# Patient Record
Sex: Male | Born: 1985 | Race: White | Hispanic: No | Marital: Married | Smoking: Former smoker
Health system: Southern US, Community
[De-identification: ages and names within clinical notes are randomized; demographics above are authoritative.]

## PROBLEM LIST (undated history)

## (undated) DIAGNOSIS — E669 Obesity, unspecified: Secondary | ICD-10-CM

## (undated) DIAGNOSIS — K219 Gastro-esophageal reflux disease without esophagitis: Secondary | ICD-10-CM

## (undated) DIAGNOSIS — R001 Bradycardia, unspecified: Secondary | ICD-10-CM

## (undated) DIAGNOSIS — T7840XA Allergy, unspecified, initial encounter: Secondary | ICD-10-CM

## (undated) HISTORY — DX: Obesity, unspecified: E66.9

## (undated) HISTORY — DX: Allergy, unspecified, initial encounter: T78.40XA

## (undated) HISTORY — DX: Bradycardia, unspecified: R00.1

---

## 2006-05-11 ENCOUNTER — Emergency Department (HOSPITAL_COMMUNITY): Admission: EM | Admit: 2006-05-11 | Discharge: 2006-05-11 | Payer: Self-pay | Admitting: Emergency Medicine

## 2007-11-08 ENCOUNTER — Emergency Department (HOSPITAL_COMMUNITY): Admission: EM | Admit: 2007-11-08 | Discharge: 2007-11-08 | Payer: Self-pay | Admitting: Emergency Medicine

## 2011-11-24 ENCOUNTER — Encounter: Payer: Self-pay | Admitting: Family

## 2011-12-08 ENCOUNTER — Encounter: Payer: Self-pay | Admitting: Family

## 2011-12-08 ENCOUNTER — Ambulatory Visit (INDEPENDENT_AMBULATORY_CARE_PROVIDER_SITE_OTHER): Payer: No Typology Code available for payment source | Admitting: Family

## 2011-12-08 VITALS — BP 116/80 | Ht 70.5 in | Wt 196.0 lb

## 2011-12-08 DIAGNOSIS — Z0000 Encounter for general adult medical examination without abnormal findings: Secondary | ICD-10-CM

## 2011-12-08 DIAGNOSIS — J309 Allergic rhinitis, unspecified: Secondary | ICD-10-CM

## 2011-12-08 DIAGNOSIS — Z23 Encounter for immunization: Secondary | ICD-10-CM

## 2011-12-08 LAB — CBC WITH DIFFERENTIAL/PLATELET
Basophils Absolute: 0 10*3/uL (ref 0.0–0.1)
Eosinophils Absolute: 0.2 10*3/uL (ref 0.0–0.7)
HCT: 45.9 % (ref 39.0–52.0)
Hemoglobin: 15.6 g/dL (ref 13.0–17.0)
Lymphs Abs: 1.9 10*3/uL (ref 0.7–4.0)
MCHC: 34 g/dL (ref 30.0–36.0)
Monocytes Relative: 6.9 % (ref 3.0–12.0)
Neutro Abs: 3.5 10*3/uL (ref 1.4–7.7)
RDW: 12.6 % (ref 11.5–14.6)

## 2011-12-08 LAB — BASIC METABOLIC PANEL
Calcium: 9.5 mg/dL (ref 8.4–10.5)
Creatinine, Ser: 0.8 mg/dL (ref 0.4–1.5)
GFR: 125.86 mL/min (ref >60.00)

## 2011-12-08 LAB — LIPID PANEL
Cholesterol: 162 mg/dL (ref 0–200)
Triglycerides: 37 mg/dL (ref 0.0–149.0)

## 2011-12-08 MED ORDER — FLUTICASONE PROPIONATE 50 MCG/ACT NA SUSP: 2.0000 | Freq: Every day | NASAL | Status: DC

## 2011-12-08 NOTE — Progress Notes (Signed)
  Subjective:    Patient ID: Timothy Coffey, male    DOB: [birth year], 26 y.o.   MRN: [id number]  HPI Patient presents for yearly preventative medicine examination. All immunizations and health maintenance protocols were reviewed with the patient and they are up to date with these protocols. Screening laboratory values were reviewed with the patient including screening of hyperlipidemia PSA renal function and hepatic function.There medications past medical history social history problem list and allergies were reviewed in detail. Goals were established with regard to weight loss exercise diet in compliance with medications   Review of Systems  Constitutional: Negative.   HENT: Positive for congestion, rhinorrhea, sneezing and postnasal drip.   Eyes: Negative.   Respiratory: Negative.   Cardiovascular: Negative.   Gastrointestinal: Negative.   Genitourinary: Negative.   Musculoskeletal: Negative.   Skin: Negative.   Neurological: Negative.   Hematological: Negative.   Psychiatric/Behavioral: Negative.    Past Medical History  Diagnosis Date  . Allergy     History   Social History  . Marital Status: Single    Spouse Name: N/A    Number of Children: N/A  . Years of Education: N/A   Occupational History  . Not on file.   Social History Main Topics  . Smoking status: Former Games developer  . Smokeless tobacco: Not on file  . Alcohol Use: Yes  . Drug Use: No  . Sexually Active:    Other Topics Concern  . Not on file   Social History Narrative  . No narrative on file    History reviewed. No pertinent past surgical history.  Family History  Problem Relation Age of Onset  . Arthritis Father     No Known Allergies  Current Outpatient Prescriptions on File Prior to Visit  Medication Sig Dispense Refill  . fluticasone (FLONASE) 50 MCG/ACT nasal spray Place 2 sprays into the nose daily.  16 g  6    BP 116/80  Ht 5' 10.5" (1.791 m)  Wt 196 lb (88.905 kg)  BMI 27.73  kg/m2chart    Objective:   Physical Exam  Constitutional: He is oriented to person, place, and time. He appears well-developed and well-nourished.  HENT:  Head: Normocephalic.  Right Ear: External ear normal.  Left Ear: External ear normal.  Nose: Nose normal.  Mouth/Throat: Oropharynx is clear and moist.  Eyes: Conjunctivae are normal. Pupils are equal, round, and reactive to light.  Neck: Normal range of motion. Neck supple.  Cardiovascular: Normal rate, regular rhythm and normal heart sounds.   Pulmonary/Chest: Effort normal and breath sounds normal.  Abdominal: Soft. Bowel sounds are normal.  Genitourinary: Penis normal.  Musculoskeletal: Normal range of motion.  Neurological: He is alert and oriented to person, place, and time.  Skin: Skin is warm and dry.  Psychiatric: He has a normal mood and affect.          Assessment & Plan:  Assessment: CPX, Allergic Rhinitis  Plan: Encouraged diet and exercise, testicular exams. Fluticasone 2 sprays in each nostril once a day and Zyrtec 10 mg once a day to help control his allergies. Labs sent to include BMP, CBC, lipids will notify patient pending results. Tdap administered. Call the office if his symptoms worsen or persist, recheck as scheduled and when necessary.

## 2011-12-08 NOTE — Patient Instructions (Addendum)
Allergic Rhinitis Allergic rhinitis is when the mucous membranes in the nose respond to allergens. Allergens are particles in the air that cause your body to have an allergic reaction. This causes you to release allergic antibodies. Through a chain of events, these eventually cause you to release histamine into the blood stream (hence the use of antihistamines). Although meant to be protective to the body, it is this release that causes your discomfort, such as frequent sneezing, congestion and an itchy runny nose.  CAUSES  The pollen allergens may come from grasses, trees, and weeds. This is seasonal allergic rhinitis, or "hay fever." Other allergens cause year-round allergic rhinitis (perennial allergic rhinitis) such as house dust mite allergen, pet dander and mold spores.  SYMPTOMS   Nasal stuffiness (congestion).   Runny, itchy nose with sneezing and tearing of the eyes.   There is often an itching of the mouth, eyes and ears.  It cannot be cured, but it can be controlled with medications. DIAGNOSIS  If you are unable to determine the offending allergen, skin or blood testing may find it. TREATMENT   Avoid the allergen.   Medications and allergy shots (immunotherapy) can help.   Hay fever may often be treated with antihistamines in pill or nasal spray forms. Antihistamines block the effects of histamine. There are over-the-counter medicines that may help with nasal congestion and swelling around the eyes. Check with your caregiver before taking or giving this medicine.  If the treatment above does not work, there are many new medications your caregiver can prescribe. Stronger medications may be used if initial measures are ineffective. Desensitizing injections can be used if medications and avoidance fails. Desensitization is when a patient is given ongoing shots until the body becomes less sensitive to the allergen. Make sure you follow up with your caregiver if problems continue. SEEK  MEDICAL CARE IF:   You develop fever (more than 100.5 F (38.1 C).   You develop a cough that does not stop easily (persistent).   You have shortness of breath.   You start wheezing.   Symptoms interfere with normal daily activities.  Document Released: 04/25/2001 Document Revised: 07/20/2011 Document Reviewed: 11/04/2008 [facility] Patient Information 2012 [place], [place].  Exercise to Stay Healthy Exercise helps you become and stay healthy. EXERCISE IDEAS AND TIPS Choose exercises that:  You enjoy.   Fit into your day.  You do not need to exercise really hard to be healthy. You can do exercises at a slow or medium level and stay healthy. You can:  Stretch before and after working out.   Try yoga, Pilates, or tai chi.   Lift weights.   Walk fast, swim, jog, run, climb stairs, bicycle, dance, or rollerskate.   Take aerobic classes.  Exercises that burn about 150 calories:  Running 1  miles in 15 minutes.   Playing volleyball for 45 to 60 minutes.   Washing and waxing a car for 45 to 60 minutes.   Playing touch football for 45 minutes.   Walking 1  miles in 35 minutes.   Pushing a stroller 1  miles in 30 minutes.   Playing basketball for 30 minutes.   Raking leaves for 30 minutes.   Bicycling 5 miles in 30 minutes.   Walking 2 miles in 30 minutes.   Dancing for 30 minutes.   Shoveling snow for 15 minutes.   Swimming laps for 20 minutes.   Walking up stairs for 15 minutes.   Bicycling 4 miles in 15  minutes.   Gardening for 30 to 45 minutes.   Jumping rope for 15 minutes.   Washing windows or floors for 45 to 60 minutes.  Document Released: 09/02/2010 Document Revised: 07/20/2011 Document Reviewed: 09/02/2010 [facility] Patient Information 2012 [place], [place].

## 2012-07-17 ENCOUNTER — Other Ambulatory Visit: Payer: Self-pay | Admitting: Family

## 2015-10-29 ENCOUNTER — Ambulatory Visit (INDEPENDENT_AMBULATORY_CARE_PROVIDER_SITE_OTHER): Payer: BLUE CROSS/BLUE SHIELD | Admitting: Adult Health

## 2015-10-29 ENCOUNTER — Other Ambulatory Visit: Payer: Self-pay | Admitting: Adult Health

## 2015-10-29 ENCOUNTER — Encounter: Payer: Self-pay | Admitting: Adult Health

## 2015-10-29 VITALS — BP 108/80 | Temp 98.7°F | Ht 71.0 in | Wt 244.9 lb

## 2015-10-29 DIAGNOSIS — J302 Other seasonal allergic rhinitis: Secondary | ICD-10-CM | POA: Diagnosis not present

## 2015-10-29 DIAGNOSIS — Z7689 Persons encountering health services in other specified circumstances: Secondary | ICD-10-CM

## 2015-10-29 DIAGNOSIS — Z7189 Other specified counseling: Secondary | ICD-10-CM

## 2015-10-29 MED ORDER — FLUTICASONE PROPIONATE 50 MCG/ACT NA SUSP: NASAL | Status: DC

## 2015-10-29 NOTE — Patient Instructions (Signed)
It was great meeting you today!  I will follow up with you regarding your blood work.  Work on diet and exercise. Lets shoot for 200lbs by this time next year.

## 2015-10-29 NOTE — Progress Notes (Signed)
Patient presents to clinic today to establish care.  He is a pleasant 30 year old male who  has a past medical history of Allergy.   Acute Concerns:  Establish Care  He has no acute concerns.   Chronic Issues:  Seasonal Allergies  - Year around allergies. Controlled with Flonase  Health Maintenance: Dental -- Twice a year  Vision -- Does not see Immunizations -- UTD Diet: Eats pretty healthy  Exercise: Does not exercise.   Past Medical History  Diagnosis Date  . Allergy     No past surgical history on file.  No current outpatient prescriptions on file prior to visit.   No current facility-administered medications on file prior to visit.    No Known Allergies  Family History  Problem Relation Age of Onset  . Arthritis Father     Social History   Social History  . Marital Status: Single    Spouse Name: N/A  . Number of Children: N/A  . Years of Education: N/A   Occupational History  . Not on file.   Social History Main Topics  . Smoking status: Former Games developermoker  . Smokeless tobacco: Not on file  . Alcohol Use: Yes  . Drug Use: No  . Sexual Activity: Not on file   Other Topics Concern  . Not on file   Social History Narrative    Review of Systems  Constitutional: Negative.   HENT: Negative.   Eyes: Negative.   Respiratory: Negative.   Cardiovascular: Negative.   Gastrointestinal: Negative.   Genitourinary: Negative.   Musculoskeletal: Negative.   Skin: Negative.   Neurological: Negative.   Endo/Heme/Allergies: Negative.   Psychiatric/Behavioral: Negative.     Temp(Src) 98.7 F (37.1 C) (Oral)  Ht 5\' 11"  (1.803 m)  Wt 244 lb 14.4 oz (111.086 kg)  BMI 34.17 kg/m2  Physical Exam  Constitutional: He is oriented to person, place, and time and well-developed, well-nourished, and in no distress. No distress.  HENT:  Head: Normocephalic and atraumatic.  Right Ear: External ear normal.  Left Ear: External ear normal.  Nose: Nose  normal.  Mouth/Throat: Oropharynx is clear and moist. No oropharyngeal exudate.  Eyes: Conjunctivae and EOM are normal. Pupils are equal, round, and reactive to light. Right eye exhibits no discharge. Left eye exhibits no discharge. No scleral icterus.  Neck: Normal range of motion. Neck supple. No JVD present. No tracheal deviation present. No thyromegaly present.  Cardiovascular: Normal rate, regular rhythm, normal heart sounds and intact distal pulses.  Exam reveals no gallop and no friction rub.   No murmur heard. Pulmonary/Chest: Effort normal and breath sounds normal. No stridor. No respiratory distress. He has no wheezes. He has no rales. He exhibits no tenderness.  Abdominal: Soft. Bowel sounds are normal. He exhibits no distension and no mass. There is no tenderness. There is no rebound and no guarding.  Musculoskeletal: Normal range of motion. He exhibits no edema or tenderness.  Lymphadenopathy:    He has no cervical adenopathy.  Neurological: He is alert and oriented to person, place, and time. He displays normal reflexes. No cranial nerve deficit. He exhibits normal muscle tone. Gait normal. Coordination normal. GCS score is 15.  Skin: Skin is warm and dry. No rash noted. He is not diaphoretic. No erythema. No pallor.  Psychiatric: Mood, memory, affect and judgment normal.  Nursing note and vitals reviewed.   Assessment/Plan:  1. Encounter to establish care - He is going to send me his labs  from his job physical.  - His father died at age 38 of cardiovascular disease. I would like him to lose 50 pounds through diet and exercise.  - Follow up as needed  2. Seasonal allergies - Controlled with current medications

## 2015-10-29 NOTE — Progress Notes (Signed)
Pre visit review using our clinic review tool, if applicable. No additional management support is needed unless otherwise documented below in the visit note. 

## 2017-01-12 ENCOUNTER — Ambulatory Visit (INDEPENDENT_AMBULATORY_CARE_PROVIDER_SITE_OTHER): Payer: Commercial Managed Care - PPO | Admitting: Adult Health

## 2017-01-12 ENCOUNTER — Encounter: Payer: Self-pay | Admitting: Adult Health

## 2017-01-12 VITALS — BP 132/70 | Temp 98.3°F | Ht 71.0 in | Wt 248.1 lb

## 2017-01-12 DIAGNOSIS — Z0001 Encounter for general adult medical examination with abnormal findings: Secondary | ICD-10-CM

## 2017-01-12 DIAGNOSIS — L309 Dermatitis, unspecified: Secondary | ICD-10-CM | POA: Diagnosis not present

## 2017-01-12 DIAGNOSIS — Z0000 Encounter for general adult medical examination without abnormal findings: Secondary | ICD-10-CM

## 2017-01-12 MED ORDER — PREDNISONE 20 MG PO TABS: 20.0000 mg | ORAL_TABLET | Freq: Every day | ORAL | 0 refills | Status: DC

## 2017-01-12 MED ORDER — TRIAMCINOLONE ACETONIDE 0.5 % EX CREA: 1.0000 "application " | TOPICAL_CREAM | Freq: Two times a day (BID) | CUTANEOUS | 6 refills | Status: DC

## 2017-01-12 NOTE — Progress Notes (Signed)
Subjective:    Patient ID: Timothy Coffey, male    DOB: 12-17-1985, 31 y.o.   MRN: [id number]  HPI  Patient presents for yearly preventative medicine examination. He is a pleasant 31 year old male who  has a past medical history of Allergy.  All immunizations and health maintenance protocols were reviewed with the patient and needed orders were placed.  Appropriate screening laboratory values were ordered for the patient including screening of hyperlipidemia, renal function and hepatic function.   Medication reconciliation,  past medical history, social history, problem list and allergies were reviewed in detail with the patient  Goals were established with regard to weight loss, exercise, and  diet in compliance with medications. He does not exercise but reports eating healthier. He and his wife have been doing organic meal delivery like [employer]. This has allowed them to cut back on portion size and are eating out less.   His only acute complaint is that of rash on his abdomen and buttocks that he has had close to a year at this point. He endorses mild itching at times, and it seems to go away when he is in the pool. Denies any wounds, drainage or vesicles. He has been using topical antifungal cream without resolution .    Review of Systems  Constitutional: Negative.   HENT: Negative.   Eyes: Negative.   Respiratory: Negative.   Cardiovascular: Negative.   Gastrointestinal: Negative.   Endocrine: Negative.   Genitourinary: Negative.   Musculoskeletal: Negative.   Skin: Positive for rash.  Allergic/Immunologic: Negative.   Neurological: Negative.   Hematological: Negative.   Psychiatric/Behavioral: Negative.   All other systems reviewed and are negative.  Past Medical History:  Diagnosis Date  . Allergy     Social History   Social History  . Marital status: Single    Spouse name: N/A  . Number of children: N/A  . Years of education: N/A   Occupational History   . Electrician    Social History Main Topics  . Smoking status: Former Games developer  . Smokeless tobacco: Not on file  . Alcohol use 0.0 oz/week     Comment: 14 beers a day   . Drug use: No  . Sexual activity: Not on file   Other Topics Concern  . Not on file   Social History Narrative   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[employer]   Married    Two children       He likes to be outside     No past surgical history on file.  Family History  Problem Relation Age of Onset  . Arthritis Father   . Heart disease Father   . Skin cancer Father   . Hypertension Unknown        Family history   . Hyperlipidemia Unknown        Family History  . Thyroid disease Mother     Allergies  Allergen Reactions  . Shellfish Allergy     "abdominal pain"    Current Outpatient Prescriptions on File Prior to Visit  Medication Sig Dispense Refill  . fluticasone (FLONASE) 50 MCG/ACT nasal spray USE 2 SPRAYS INTO THE NOSE ONCE DAILY 16 g 6   No current facility-administered medications on file prior to visit.     BP 132/70 (BP Location: Left Arm, Patient Position: Sitting, Cuff Size: Normal)   Temp 98.3 F (36.8 C) (Oral)   Ht 5\' 11"  (1.803 m)   Wt 248 lb  1.6 oz (112.5 kg)   BMI 34.60 kg/m       Objective:   Physical Exam  Constitutional: He is oriented to person, place, and time. He appears well-developed and well-nourished. No distress.   Obese   HENT:  Head: Normocephalic and atraumatic.  Right Ear: External ear normal.  Left Ear: External ear normal.  Nose: Nose normal.  Mouth/Throat: Oropharynx is clear and moist. No oropharyngeal exudate.  Eyes: Conjunctivae and EOM are normal. Pupils are equal, round, and reactive to light. Right eye exhibits no discharge. Left eye exhibits no discharge. No scleral icterus.  Neck: Normal range of motion. Neck supple. No JVD present. No tracheal deviation present. No thyromegaly present.  Cardiovascular: Normal rate, regular rhythm, normal heart  sounds and intact distal pulses.  Exam reveals no gallop and no friction rub.   No murmur heard. Pulmonary/Chest: Effort normal and breath sounds normal. No stridor. No respiratory distress. He has no wheezes. He has no rales. He exhibits no tenderness.  Abdominal: Soft. Bowel sounds are normal. He exhibits no distension and no mass. There is no tenderness. There is no rebound and no guarding.  Musculoskeletal: Normal range of motion. He exhibits no edema, tenderness or deformity.  Lymphadenopathy:    He has no cervical adenopathy.  Neurological: He is alert and oriented to person, place, and time. He has normal reflexes. He displays normal reflexes. No cranial nerve deficit. He exhibits normal muscle tone. Coordination normal.  Skin: Skin is warm and dry. Rash noted. He is not diaphoretic. No erythema. No pallor.  Red papular rash on lower abdomen and on buttocks. No vesicles or pustules.   Psychiatric: He has a normal mood and affect. His behavior is normal. Judgment and thought content normal.  Nursing note and vitals reviewed.     Assessment & Plan:  1. Routine general medical examination at a health care facility - Patient had labs ( CBC, BMP, Lipid, and A1c) done at work. I reviewed these results with him. LDL was 132 and total cholesterol 192. All other labs normal.  - Educated on the importance of heart healthy diet and aerobic exercise  - Follow up in one year or sooner if needed - Labs scanned into chart  2. Eczema, unspecified type - Appears as eczema. Doubt folliculitis at this time. Will trial oral and topical prednisone therapy. Follow up if no resolved in 2 weeks. Consider antibiotic treatment at that time.  - predniSONE (DELTASONE) 20 MG tablet; Take 1 tablet (20 mg total) by mouth daily with breakfast.  Dispense: 10 tablet; Refill: 0 - triamcinolone cream (KENALOG) 0.5 %; Apply 1 application topically 2 (two) times daily.  Dispense: 30 g; Refill: 6   Wellsite geologistCory Koralynn Greenspan

## 2017-02-02 ENCOUNTER — Ambulatory Visit (INDEPENDENT_AMBULATORY_CARE_PROVIDER_SITE_OTHER): Payer: Commercial Managed Care - PPO | Admitting: Adult Health

## 2017-02-02 ENCOUNTER — Encounter: Payer: Self-pay | Admitting: Adult Health

## 2017-02-02 VITALS — BP 138/60 | Temp 98.3°F | Ht 71.0 in | Wt 246.4 lb

## 2017-02-02 DIAGNOSIS — L409 Psoriasis, unspecified: Secondary | ICD-10-CM | POA: Diagnosis not present

## 2017-02-02 NOTE — Progress Notes (Signed)
Subjective:    Patient ID: Timothy Coffey, male    DOB: 1985/09/12, 31 y.o.   MRN: [id number]  HPI  31 year old male who  has a past medical history of Allergy. He presents to the office for follow up regarding rash of abdomen and buttocks. When I saw him last time I had prescribed kenalog cream and prednisone 20 mg daily x 10 days. He reports using medications as directed but that his rash did not improve and believes that once he stopped using the kenalog cream his rash broke out worse.   Today in the office he has well demarcated red scaly rash on groin and lower back that extends into his groin. No patches on scalp, elbows, or knees.    Review of Systems See HPI   Past Medical History:  Diagnosis Date  . Allergy     Social History   Social History  . Marital status: Single    Spouse name: N/A  . Number of children: N/A  . Years of education: N/A   Occupational History  . Electrician    Social History Main Topics  . Smoking status: Former Games developermoker  . Smokeless tobacco: Never Used  . Alcohol use 0.0 oz/week     Comment: 14 beers a day   . Drug use: No  . Sexual activity: Not on file   Other Topics Concern  . Not on file   Social History Narrative   He is an Personnel officerelectrician for [employer]   Married    Two children       He likes to be outside     No past surgical history on file.  Family History  Problem Relation Age of Onset  . Arthritis Father   . Heart disease Father   . Skin cancer Father   . Hypertension Unknown        Family history   . Hyperlipidemia Unknown        Family History  . Thyroid disease Mother     Allergies  Allergen Reactions  . Shellfish Allergy     "abdominal pain"    Current Outpatient Prescriptions on File Prior to Visit  Medication Sig Dispense Refill  . fluticasone (FLONASE) 50 MCG/ACT nasal spray USE 2 SPRAYS INTO THE NOSE ONCE DAILY 16 g 6  . triamcinolone cream (KENALOG) 0.5 % Apply 1 application topically 2 (two)  times daily. 30 g 6   No current facility-administered medications on file prior to visit.     BP 138/60 (BP Location: Left Arm, Patient Position: Sitting, Cuff Size: Normal)   Temp 98.3 F (36.8 C) (Oral)   Ht 5\' 11"  (1.803 m)   Wt 246 lb 6.4 oz (111.8 kg)   BMI 34.37 kg/m       Objective:   Physical Exam  Constitutional: He is oriented to person, place, and time. He appears well-developed and well-nourished. No distress.  Neurological: He is alert and oriented to person, place, and time.  Skin: Skin is warm and dry. Rash noted.   well demarcated red scaly rash on groin and lower back that extends into his groin. No patches on scalp, elbows, or knees.   Psychiatric: He has a normal mood and affect. His behavior is normal. Judgment and thought content normal.  Nursing note and vitals reviewed.     Assessment & Plan:  1. Psoriasis - His rash appears as psoriasis during this visit.  - I am going to refer him to  dermatology for further treatment   Shirline Frees, NP

## 2017-05-04 ENCOUNTER — Encounter: Payer: Self-pay | Admitting: Adult Health

## 2017-10-12 ENCOUNTER — Ambulatory Visit: Payer: Commercial Managed Care - PPO | Admitting: Adult Health

## 2017-10-12 ENCOUNTER — Encounter: Payer: Self-pay | Admitting: Adult Health

## 2017-10-12 VITALS — BP 130/88 | Temp 98.2°F | Wt 265.0 lb

## 2017-10-12 DIAGNOSIS — K2100 Gastro-esophageal reflux disease with esophagitis, without bleeding: Secondary | ICD-10-CM

## 2017-10-12 DIAGNOSIS — S161XXA Strain of muscle, fascia and tendon at neck level, initial encounter: Secondary | ICD-10-CM

## 2017-10-12 MED ORDER — CYCLOBENZAPRINE HCL 10 MG PO TABS: 10.0000 mg | ORAL_TABLET | Freq: Every day | ORAL | 0 refills | Status: DC

## 2017-10-12 MED ORDER — OMEPRAZOLE 20 MG PO CPDR: 20.0000 mg | DELAYED_RELEASE_CAPSULE | Freq: Every day | ORAL | 0 refills | Status: DC

## 2017-10-12 NOTE — Progress Notes (Signed)
Subjective:    Patient ID: Timothy Coffey, male    DOB: 03-Oct-1985, 32 y.o.   MRN: [id number]  HPI  32 year old male who  has a past medical history of Allergy. He presents to the office today for the complaint of neck and head pain. He reports that oen week ago he had a " violent sneeze " that caused him to feel like he pulled a muscle in his neck. He was seen by a Tele Doc who told him to take motrin 600 mg TID. He reports doing this but has not noticed any improvement. He continues to have pain at the base of his neck. He denies any headaches or blurred vision   He also reports that he has intermittent abdominal pain that is mostly prevalent after eating. Pain is relieved after a bowel movement. Does endorse pain as a burning sensation that radiates up his throat. This has been present on and off for the last 5 years.   Review of Systems See HPI   Past Medical History:  Diagnosis Date  . Allergy     Social History   Socioeconomic History  . Marital status: Single    Spouse name: Not on file  . Number of children: Not on file  . Years of education: Not on file  . Highest education level: Not on file  Social Needs  . Financial resource strain: Not on file  . Food insecurity - worry: Not on file  . Food insecurity - inability: Not on file  . Transportation needs - medical: Not on file  . Transportation needs - non-medical: Not on file  Occupational History  . Occupation: Personnel officerlectrician  Tobacco Use  . Smoking status: Former Games developermoker  . Smokeless tobacco: Never Used  Substance and Sexual Activity  . Alcohol use: Yes    Alcohol/week: 0.0 oz    Comment: 14 beers a day   . Drug use: No  . Sexual activity: Not on file  Other Topics Concern  . 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 No pertinent surgical history.  Family History  Problem Relation Age of  Onset  . Arthritis Father   . Heart disease Father   . Skin cancer Father   . Hypertension Unknown        Family history   . Hyperlipidemia Unknown        Family History  . Thyroid disease Mother     Allergies  Allergen Reactions  . Shellfish Allergy     "abdominal pain"    Current Outpatient Medications on File Prior to Visit  Medication Sig Dispense Refill  . Multiple Vitamins-Minerals (ADULT GUMMY PO) Take by mouth. MENS ONE DAILY    . fluticasone (FLONASE) 50 MCG/ACT nasal spray USE 2 SPRAYS INTO THE NOSE ONCE DAILY (Patient not taking: Reported on 10/12/2017) 16 g 6   No current facility-administered medications on file prior to visit.     BP 130/88 (BP Location: Left Arm)   Temp 98.2 F (36.8 C) (Oral)   Wt 265 lb (120.2 kg)   BMI 36.96 kg/m       Objective:   Physical Exam  Constitutional: He is oriented to person, place, and time. He appears well-developed and well-nourished. No distress.  Cardiovascular: Normal rate, regular rhythm,  normal heart sounds and intact distal pulses. Exam reveals no gallop and no friction rub.  No murmur heard. Pulmonary/Chest: Effort normal and breath sounds normal. No respiratory distress. He has no wheezes. He has no rales. He exhibits no tenderness.  Abdominal: Soft. Bowel sounds are normal. He exhibits no distension and no mass. There is no tenderness. There is no rebound and no guarding.  Musculoskeletal: He exhibits tenderness (mild tenderness to left mid trapezius ).  Neurological: He is alert and oriented to person, place, and time.  Skin: Skin is warm and dry. No rash noted. He is not diaphoretic. No erythema. No pallor.  Psychiatric: He has a normal mood and affect. His behavior is normal. Judgment and thought content normal.  Nursing note and vitals reviewed.     Assessment & Plan:  1. Strain of neck muscle, initial encounter - cyclobenzaprine (FLEXERIL) 10 MG tablet; Take 1 tablet (10 mg total) by mouth at bedtime.   Dispense: 10 tablet; Refill: 0 - d/c motrin  - can take tylenol  Can use heating pad - follow up if no improvement  2. Gastroesophageal reflux disease with esophagitis  - omeprazole (PRILOSEC) 20 MG capsule; Take 1 capsule (20 mg total) by mouth daily.  Dispense: 90 capsule; Refill: 0   Shirline Frees, NP

## 2017-11-07 LAB — HEPATIC FUNCTION PANEL
ALK PHOS: 76 (ref 25–125)
ALT: 45 — AB (ref 10–40)
AST: 28 (ref 14–40)
Bilirubin, Total: 0.6

## 2017-11-07 LAB — BASIC METABOLIC PANEL
BUN: 12 (ref 4–21)
Creatinine: 0.9 (ref 0.6–1.3)
Glucose: 108
Potassium: 5.4 — AB (ref 3.4–5.3)
SODIUM: 141 (ref 137–147)

## 2017-11-07 LAB — HEMOGLOBIN A1C: Hemoglobin A1C: 5.1

## 2017-11-07 LAB — LIPID PANEL
CHOLESTEROL: 195 (ref 0–200)
HDL: 41 (ref 35–70)
LDL CALC: 18
TRIGLYCERIDES: 88 (ref 40–160)

## 2018-01-03 ENCOUNTER — Other Ambulatory Visit: Payer: Self-pay | Admitting: Adult Health

## 2018-01-03 DIAGNOSIS — K2100 Gastro-esophageal reflux disease with esophagitis, without bleeding: Secondary | ICD-10-CM

## 2018-01-03 NOTE — Telephone Encounter (Signed)
Sent to the pharmacy by e-scribe.  Pt has upcoming appt on 01/11/18

## 2018-01-11 ENCOUNTER — Encounter: Payer: Self-pay | Admitting: Adult Health

## 2018-01-11 ENCOUNTER — Ambulatory Visit (INDEPENDENT_AMBULATORY_CARE_PROVIDER_SITE_OTHER): Payer: Commercial Managed Care - PPO | Admitting: Adult Health

## 2018-01-11 VITALS — BP 126/90 | Temp 98.2°F | Ht 70.5 in | Wt 259.0 lb

## 2018-01-11 DIAGNOSIS — E668 Other obesity: Secondary | ICD-10-CM | POA: Diagnosis not present

## 2018-01-11 DIAGNOSIS — Z0000 Encounter for general adult medical examination without abnormal findings: Secondary | ICD-10-CM

## 2018-01-11 NOTE — Progress Notes (Signed)
Subjective:    Patient ID: Timothy Coffey, male    DOB: 1985-10-01, 32 y.o.   MRN: [id number]  HPI  Patient presents for yearly preventative medicine examination. He is a pleasant 32 year old male who  has a past medical history of Allergy.  All immunizations and health maintenance protocols were reviewed with the patient and needed orders were placed.  Medication reconciliation,  past medical history, social history, problem list and allergies were reviewed in detail with the patient  Goals were established with regard to weight loss, exercise, and  diet in compliance with medications. He has been working on exercise ( walking on treadmill).  Wt Readings from Last 3 Encounters:  01/11/18 259 lb (117.5 kg)  10/12/17 265 lb (120.2 kg)  02/02/17 246 lb 6.4 oz (111.8 kg)   He has no acute complaints.   Review of Systems  Constitutional: Negative.   HENT: Negative.   Eyes: Negative.   Respiratory: Negative.   Cardiovascular: Negative.   Gastrointestinal: Negative.   Genitourinary: Negative.   Musculoskeletal: Negative.   Neurological: Negative.   Hematological: Negative.   Psychiatric/Behavioral: Negative.   All other systems reviewed and are negative.  Past Medical History:  Diagnosis Date  . Allergy   . Obesity     Social History   Socioeconomic History  . Marital status: Single    Spouse name: Not on file  . Number of children: Not on file  . Years of education: Not on file  . Highest education level: Not on file  Occupational History  . Occupation: Engineer, sitelectrician  Social Needs  . Financial resource strain: Not on file  . Food insecurity:    Worry: Not on file    Inability: Not on file  . Transportation needs:    Medical: Not on file    Non-medical: Not on file  Tobacco Use  . Smoking status: Former Games developermoker  . Smokeless tobacco: Never Used  Substance and Sexual Activity  . Alcohol use: Yes    Alcohol/week: 0.0 oz    Comment: 14 beers a day   . Drug  use: No  . Sexual activity: Not on file  Lifestyle  . Physical activity:    Days per week: Not on file    Minutes per session: Not on file  . Stress: Not on file  Relationships  . Social connections:    Talks on phone: Not on file    Gets together: Not on file    Attends religious service: Not on file    Active member of club or organization: Not on file    Attends meetings of clubs or organizations: Not on file    Relationship status: Not on file  . Intimate partner violence:    Fear of current or ex partner: Not on file    Emotionally abused: Not on file    Physically abused: Not on file    Forced sexual activity: Not on file  Other Topics Concern  . 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 No pertinent surgical history.  Family History  Problem Relation Age of Onset  . Arthritis Father   . Heart disease Father   . Skin cancer Father   . Hypertension Unknown        Family history   .  Hyperlipidemia Unknown        Family History  . Thyroid disease Mother     Allergies  Allergen Reactions  . Shellfish Allergy     "abdominal pain"    Current Outpatient Medications on File Prior to Visit  Medication Sig Dispense Refill  . fluticasone (FLONASE) 50 MCG/ACT nasal spray USE 2 SPRAYS INTO THE NOSE ONCE DAILY 16 g 6  . Multiple Vitamins-Minerals (ADULT GUMMY PO) Take by mouth. MENS ONE DAILY    . omeprazole (PRILOSEC) 20 MG capsule TAKE 1 CAPSULE BY MOUTH EVERY DAY 90 capsule 0   No current facility-administered medications on file prior to visit.     BP 126/90   Temp 98.2 F (36.8 C) (Oral)   Ht 5' 10.5" (1.791 m)   Wt 259 lb (117.5 kg)   BMI 36.64 kg/m       Objective:   Physical Exam  Constitutional: He is oriented to person, place, and time. He appears well-developed and well-nourished. No distress.  Obese   HENT:  Head:  Normocephalic and atraumatic.  Right Ear: External ear normal.  Left Ear: External ear normal.  Nose: Nose normal.  Mouth/Throat: Oropharynx is clear and moist. No oropharyngeal exudate.  Eyes: Pupils are equal, round, and reactive to light. Conjunctivae and EOM are normal. Right eye exhibits no discharge. Left eye exhibits no discharge. No scleral icterus.  Neck: Normal range of motion. Neck supple. No JVD present. No tracheal deviation present. No thyromegaly present.  Cardiovascular: Normal rate, regular rhythm, normal heart sounds and intact distal pulses. Exam reveals no gallop and no friction rub.  No murmur heard. Pulmonary/Chest: Effort normal and breath sounds normal. No stridor. No respiratory distress. He has no wheezes. He has no rales. He exhibits no tenderness.  Abdominal: Soft. Bowel sounds are normal. He exhibits no distension and no mass. There is no tenderness. There is no rebound and no guarding. No hernia.  Genitourinary:  Genitourinary Comments: Deferred   Musculoskeletal: Normal range of motion. He exhibits no edema, tenderness or deformity.  Lymphadenopathy:    He has no cervical adenopathy.  Neurological: He is alert and oriented to person, place, and time. He displays normal reflexes. No cranial nerve deficit or sensory deficit. He exhibits normal muscle tone. Coordination normal.  Skin: Skin is warm and dry. Capillary refill takes less than 2 seconds. No rash noted. He is not diaphoretic. No erythema. No pallor.  Psychiatric: He has a normal mood and affect. His behavior is normal. Judgment and thought content normal.  Nursing note and vitals reviewed.     Assessment & Plan:  1. Routine general medical examination at a health care facility - patient had labs done at work. He brought these labs with him and we reviewed in detail.  - Nothing overtly concerning noted.  - Labs abstracted  - Encouraged continued weight loss through diet and exericse - Follow up in  one year or sooner if needed 2. Other obesity - Continue with diet and exercise   Shirline Frees, NP

## 2018-01-18 ENCOUNTER — Encounter: Payer: Self-pay | Admitting: Adult Health

## 2018-01-22 ENCOUNTER — Other Ambulatory Visit: Payer: Self-pay | Admitting: Adult Health

## 2018-07-02 ENCOUNTER — Ambulatory Visit: Payer: Self-pay

## 2018-07-02 NOTE — Telephone Encounter (Signed)
[place]. [place] in [place]. C/o neck and chest pain. Pain is at both collar bones and radiates around both arm pits. States he may have "pulled muscles at work." Denies any shortness of breath, sweating or nausea. Appointment made for Thursday when he flies home.Instructed to go to ED if pain worsens or he develops above symptoms. Verbalizes understanding.  Reason for Disposition . [1] MODERATE neck pain (e.g., interferes with normal activities AND [2] present > 3 days  Answer Assessment - Initial Assessment Questions 1. ONSET: "When did the pain begin?"      Started 1-2 weeks ago 2. LOCATION: "Where does it hurt?"      At both collar bones 3. PATTERN "Does the pain come and go, or has it been constant since it started?"      Comes and goes 4. SEVERITY: "How bad is the pain?"  (Scale 1-10; or mild, moderate, severe)   - MILD (1-3): doesn't interfere with normal activities    - MODERATE (4-7): interferes with normal activities or awakens from sleep    - SEVERE (8-10):  excruciating pain, unable to do any normal activities       5 5. RADIATION: "Does the pain go anywhere else, shoot into your arms?"     Under arm pits 6. CORD SYMPTOMS: "Any weakness or numbness of the arms or legs?"     No 7. CAUSE: "What do you think is causing the neck pain?"     Muscle strain 8. NECK OVERUSE: "Any recent activities that involved turning or twisting the neck?"     No 9. OTHER SYMPTOMS: "Do you have any other symptoms?" (e.g., headache, fever, chest pain, difficulty breathing, neck swelling)      Some chest pain at collar bone 10. PREGNANCY: "Is there any chance you are pregnant?" "When was your last menstrual period?"       n/a  Protocols used: NECK PAIN OR STIFFNESS-A-AH

## 2018-07-04 ENCOUNTER — Encounter: Payer: Self-pay | Admitting: Family Medicine

## 2018-07-04 ENCOUNTER — Ambulatory Visit: Payer: Managed Care, Other (non HMO) | Admitting: Family Medicine

## 2018-07-04 VITALS — BP 110/90 | HR 95 | Temp 97.8°F | Ht 70.5 in | Wt 251.6 lb

## 2018-07-04 DIAGNOSIS — R0789 Other chest pain: Secondary | ICD-10-CM

## 2018-07-04 MED ORDER — CYCLOBENZAPRINE HCL 5 MG PO TABS: 5.0000 mg | ORAL_TABLET | Freq: Every evening | ORAL | 0 refills | Status: DC | PRN

## 2018-07-04 NOTE — Patient Instructions (Signed)
BEFORE YOU LEAVE: -ekg -follow up: 2-4 weeks  Aleve per instructions only if needed for pain.  Heat as needed.  Can try the muscle relaxer at night as needed.  Stretching as we discussed.  I hope you are feeling better soon! Seek care promptly if your symptoms worsen, new concerns arise or you are not improving with treatment.

## 2018-07-04 NOTE — Progress Notes (Signed)
  HPI:  Using dictation device. Unfortunately this device frequently misinterprets words/phrases.  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  Past Medical History:  Diagnosis Date  . Allergy   . Obesity     History reviewed. No pertinent surgical history.  Family History  Problem Relation Age of Onset  . Arthritis Father   . Heart disease Father   . Skin cancer Father   . Hypertension Unknown        Family history   . Hyperlipidemia Unknown        Family History  . Thyroid disease Mother     SOCIAL HX: see hpi   Current Outpatient Medications:  .  fluticasone (FLONASE) 50 MCG/ACT nasal spray, USE 2 SPRAYS INTO THE NOSE ONCE DAILY (Patient taking differently: as needed. USE 2 SPRAYS INTO THE NOSE ONCE DAILY), Disp: 16 g, Rfl: 6 .  Multiple Vitamins-Minerals (ADULT GUMMY PO), Take by mouth. MENS ONE DAILY, Disp: , Rfl:  .  omeprazole (PRILOSEC) 20 MG capsule, TAKE 1 CAPSULE BY MOUTH EVERY DAY (Patient taking differently: as needed. ), Disp: 90 capsule, Rfl: 0  EXAM:  Vitals:   07/04/18 1301  BP: 110/90  Pulse: 95  Temp: 97.8 F (36.6 C)  SpO2: 98%    Body mass index is 35.59 kg/m.  GENERAL: vitals reviewed and listed above, alert, oriented, appears well hydrated and in no acute distress  HEENT: atraumatic, conjunttiva clear, no obvious abnormalities on inspection of external nose and ears  NECK: no obvious masses on inspection  LUNGS: clear to auscultation bilaterally, no wheezes, rales or rhonchi, good air movement  CV: HRRR, no  peripheral edema  MS: moves all extremities without noticeable abnormality  PSYCH: pleasant and cooperative, no obvious depression or anxiety  ASSESSMENT AND PLAN:  Discussed the following assessment and plan:  Atypical chest pain  Chest wall pain  -we discussed possible serious and likely etiologies, workup and treatment, treatment risks and return precautions; suspect muscular given reproducible on exam, bilat and symptoms -after this discussion, Lauris opted for muscle relaxer, prn analgesic, heat, stretching (demonstrated), EKG with NSR -follow up advised 2-4 weeks -of course, we advised Dohn  to return or notify a doctor sooner if symptoms worsen or persist or new concerns arise.   Patient Instructions  BEFORE YOU LEAVE: -ekg -follow up: 2-4 weeks  Aleve per instructions only if needed for pain.  Heat as needed.  Can try the muscle relaxer at night as needed.  Stretching as we discussed.  I hope you are feeling better soon! Seek care promptly if your symptoms worsen, new concerns arise or you are not improving with treatment.      Terressa KoyanagiHannah R Embrie Mikkelsen, DO

## 2018-07-05 ENCOUNTER — Encounter: Payer: Self-pay | Admitting: Adult Health

## 2018-07-05 DIAGNOSIS — K2100 Gastro-esophageal reflux disease with esophagitis, without bleeding: Secondary | ICD-10-CM

## 2018-07-08 MED ORDER — OMEPRAZOLE 20 MG PO CPDR
20.0000 mg | DELAYED_RELEASE_CAPSULE | Freq: Every day | ORAL | 1 refills | Status: DC
Start: 2018-07-08 — End: 2020-01-23

## 2018-08-06 ENCOUNTER — Encounter: Payer: Self-pay | Admitting: Adult Health

## 2018-08-06 ENCOUNTER — Ambulatory Visit: Payer: Managed Care, Other (non HMO) | Admitting: Adult Health

## 2018-08-06 ENCOUNTER — Ambulatory Visit (INDEPENDENT_AMBULATORY_CARE_PROVIDER_SITE_OTHER): Payer: Managed Care, Other (non HMO)

## 2018-08-06 VITALS — BP 118/80 | Temp 98.2°F | Wt 249.0 lb

## 2018-08-06 DIAGNOSIS — R109 Unspecified abdominal pain: Secondary | ICD-10-CM | POA: Diagnosis not present

## 2018-08-06 DIAGNOSIS — T148XXA Other injury of unspecified body region, initial encounter: Secondary | ICD-10-CM | POA: Diagnosis not present

## 2018-08-06 LAB — BASIC METABOLIC PANEL
BUN: 15 mg/dL (ref 6–23)
CO2: 29 meq/L (ref 19–32)
CREATININE: 0.99 mg/dL (ref 0.40–1.50)
Calcium: 9.8 mg/dL (ref 8.4–10.5)
Chloride: 100 mEq/L (ref 96–112)
GFR: 93.71 mL/min (ref >60.00)
Glucose, Bld: 90 mg/dL (ref 70–99)
POTASSIUM: 4.4 meq/L (ref 3.5–5.1)
SODIUM: 137 meq/L (ref 135–145)

## 2018-08-06 LAB — POCT URINALYSIS DIPSTICK
BILIRUBIN UA: NEGATIVE
Glucose, UA: NEGATIVE
Ketones, UA: NEGATIVE
Leukocytes, UA: NEGATIVE
NITRITE UA: NEGATIVE
PH UA: 8 (ref 5.0–8.0)
PROTEIN UA: NEGATIVE
RBC UA: NEGATIVE
Spec Grav, UA: 1.015 (ref 1.010–1.025)
UROBILINOGEN UA: 0.2 U/dL

## 2018-08-06 MED ORDER — METHYLPREDNISOLONE 4 MG PO TBPK: ORAL_TABLET | ORAL | 0 refills | Status: DC

## 2018-08-06 MED ORDER — BACLOFEN 10 MG PO TABS: 10.0000 mg | ORAL_TABLET | Freq: Every day | ORAL | 0 refills | Status: DC

## 2018-08-06 NOTE — Progress Notes (Addendum)
Subjective:    Patient ID: Timothy Coffey, male    DOB: 03/17/1986, 32 y.o.   MRN: [id number]  HPI 32 year old male who  has a past medical history of Allergy and Obesity.  He presents to the office today for one month follow up. He was seen by another provider for an acute issue of chest pain. At this time his pain was described as soreness in upper bilateral chest. He had no exertional chest pain, SOB, fevers, malaise, palpitations, swelling.   EKG showed NSR.   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   He was prescribed Flexeril, which he reports has not worked to help with the pain. He continues to have a soreness in bilateral upper chest. Denies radiating chest pain or shortness of breath   He also complains of intermittent left flank pain x 3-4 weeks that radiates to left lower quadrant. Denies diarrhea, constipation, urinary symptoms, fevers,  nausea or vomiting. No history of diverticulitis    Review of Systems See HPI   Past Medical History:  Diagnosis Date  . Allergy   . Obesity     Social History   Socioeconomic History  . Marital status: Single    Spouse name: Not on file  . Number of children: Not on file  . Years of education: Not on file  . Highest education level: Not on file  Occupational History  . Occupation: Engineer, sitelectrician  Social Needs  . Financial resource strain: Not on file  . Food insecurity:    Worry: Not on file    Inability: Not on file  . Transportation needs:    Medical: Not on file    Non-medical: Not on file  Tobacco Use  . Smoking status: Former Games developermoker  . Smokeless tobacco: Never Used  Substance and Sexual Activity  . Alcohol use: Yes    Alcohol/week: 0.0 standard drinks    Comment: 14 beers a day   . Drug use: No  . Sexual activity: Not on file  Lifestyle  . Physical activity:    Days per week: Not on file    Minutes per session: Not on file  . Stress: Not on  file  Relationships  . Social connections:    Talks on phone: Not on file    Gets together: Not on file    Attends religious service: Not on file    Active member of club or organization: Not on file    Attends meetings of clubs or organizations: Not on file    Relationship status: Not on file  . Intimate partner violence:    Fear of current or ex partner: Not on file    Emotionally abused: Not on file    Physically abused: Not on file    Forced sexual activity: Not on file  Other Topics Concern  . 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 No pertinent surgical history.  Family History  Problem Relation Age of Onset  . Arthritis Father   . Heart disease Father   . Skin cancer Father   . Hypertension Unknown        Family history   . Hyperlipidemia Unknown        Family  History  . Thyroid disease Mother     Allergies  Allergen Reactions  . Shellfish Allergy     "abdominal pain"    Current Outpatient Medications on File Prior to Visit  Medication Sig Dispense Refill  . fluticasone (FLONASE) 50 MCG/ACT nasal spray USE 2 SPRAYS INTO THE NOSE ONCE DAILY (Patient taking differently: as needed. USE 2 SPRAYS INTO THE NOSE ONCE DAILY) 16 g 6  . Multiple Vitamins-Minerals (ADULT GUMMY PO) Take by mouth. MENS ONE DAILY    . omeprazole (PRILOSEC) 20 MG capsule Take 1 capsule (20 mg total) by mouth daily. 90 capsule 1   No current facility-administered medications on file prior to visit.     BP 118/80   Temp 98.2 F (36.8 C)   Wt 249 lb (112.9 kg)   BMI 35.22 kg/m       Objective:   Physical Exam Vitals signs and nursing note reviewed.  Constitutional:      Appearance: Normal appearance. He is normal weight.  Neck:     Musculoskeletal: Normal range of motion and neck supple.  Cardiovascular:     Rate and Rhythm: Normal rate and regular rhythm.      Pulses: Normal pulses.  Pulmonary:     Effort: Pulmonary effort is normal.     Breath sounds: Normal breath sounds.  Abdominal:     General: Abdomen is flat. Bowel sounds are normal.     Tenderness: There is abdominal tenderness in the left lower quadrant. There is no right CVA tenderness, left CVA tenderness or rebound. Negative signs include Murphy's sign, Rovsing's sign, McBurney's sign, psoas sign and obturator sign.  Skin:    General: Skin is warm and dry.     Capillary Refill: Capillary refill takes less than 2 seconds.  Neurological:     General: No focal deficit present.     Mental Status: He is alert and oriented to person, place, and time. Mental status is at baseline.       Assessment & Plan:  1. Muscle strain - No concern for cardiac or pulmonary issue. Appears muscular from work duties but has been constant for about one month. Will trial prednisone dose pack and muscle relaxer.  - Consider CT of chest if no improvement  - Basic Metabolic Panel - methylPREDNISolone (MEDROL DOSEPAK) 4 MG TBPK tablet; Take as directed  Dispense: 21 tablet; Refill: 0 - baclofen (LIORESAL) 10 MG tablet; Take 1 tablet (10 mg total) by mouth at bedtime for 7 days.  Dispense: 30 each; Refill: 0 - Follow up if no improvement  2. Left flank pain - Possible constipation or kidney stone but cannot rule out diverticulitis at this point. Will get KUB and consider CT abdomen  - DG Abd 1 View; Future - Basic Metabolic Panel - POC Urinalysis Dipstick - DG Abd 1 View  Shirline Freesory Gayleen Sholtz, NP

## 2018-08-13 ENCOUNTER — Other Ambulatory Visit: Payer: Self-pay | Admitting: Adult Health

## 2018-08-13 ENCOUNTER — Encounter: Payer: Self-pay | Admitting: Adult Health

## 2018-08-13 DIAGNOSIS — R1084 Generalized abdominal pain: Secondary | ICD-10-CM

## 2018-08-13 DIAGNOSIS — R0789 Other chest pain: Secondary | ICD-10-CM

## 2018-08-15 ENCOUNTER — Emergency Department (HOSPITAL_COMMUNITY): Payer: Managed Care, Other (non HMO)

## 2018-08-15 ENCOUNTER — Encounter (HOSPITAL_COMMUNITY): Payer: Self-pay | Admitting: Emergency Medicine

## 2018-08-15 ENCOUNTER — Other Ambulatory Visit: Payer: Self-pay

## 2018-08-15 ENCOUNTER — Emergency Department (HOSPITAL_COMMUNITY)
Admission: EM | Admit: 2018-08-15 | Discharge: 2018-08-16 | Discharge status: Against Medical Advice | Payer: Managed Care, Other (non HMO) | Attending: Emergency Medicine | Admitting: Emergency Medicine

## 2018-08-15 DIAGNOSIS — Z5321 Procedure and treatment not carried out due to patient leaving prior to being seen by health care provider: Secondary | ICD-10-CM | POA: Insufficient documentation

## 2018-08-15 DIAGNOSIS — R0789 Other chest pain: Secondary | ICD-10-CM | POA: Diagnosis present

## 2018-08-15 LAB — CBC
HEMATOCRIT: 48.8 % (ref 39.0–52.0)
Hemoglobin: 16.1 g/dL (ref 13.0–17.0)
MCH: 29.8 pg (ref 26.0–34.0)
MCHC: 33 g/dL (ref 30.0–36.0)
MCV: 90.4 fL (ref 80.0–100.0)
NRBC: 0 % (ref 0.0–0.2)
PLATELETS: 247 10*3/uL (ref 150–400)
RBC: 5.4 MIL/uL (ref 4.22–5.81)
RDW: 11.9 % (ref 11.5–15.5)
WBC: 9.4 10*3/uL (ref 4.0–10.5)

## 2018-08-15 LAB — BASIC METABOLIC PANEL
Anion gap: 10 (ref 5–15)
BUN: 16 mg/dL (ref 6–20)
CALCIUM: 9.5 mg/dL (ref 8.9–10.3)
CO2: 29 mmol/L (ref 22–32)
CREATININE: 1.01 mg/dL (ref 0.61–1.24)
Chloride: 100 mmol/L (ref 98–111)
GFR calc non Af Amer: >60 mL/min (ref >60)
GLUCOSE: 98 mg/dL (ref 70–99)
Potassium: 4.1 mmol/L (ref 3.5–5.1)
Sodium: 139 mmol/L (ref 135–145)

## 2018-08-15 LAB — I-STAT TROPONIN, ED: Troponin i, poc: 0.01 ng/mL (ref 0.00–0.08)

## 2018-08-15 NOTE — ED Triage Notes (Addendum)
Onset one month ago developed chest pain and seen his primary doctor who prescribed a muscle relaxer. States also prescribed prednisone for his abdominal pain. Concerned still having pain and leaving for vacation Sunday and primary doctor wanted to get a CT scan of his chest and abdomen.

## 2018-08-16 NOTE — ED Notes (Addendum)
Called pt 3x no answer for vitals recheck

## 2018-08-28 ENCOUNTER — Encounter: Payer: Self-pay | Admitting: Radiology

## 2018-08-28 ENCOUNTER — Other Ambulatory Visit: Payer: Self-pay | Admitting: Adult Health

## 2018-08-28 DIAGNOSIS — T148XXA Other injury of unspecified body region, initial encounter: Secondary | ICD-10-CM

## 2018-08-29 ENCOUNTER — Emergency Department (HOSPITAL_COMMUNITY)
Admission: EM | Admit: 2018-08-29 | Discharge: 2018-08-30 | Disposition: A | Discharge status: Home / Self Care | Payer: Managed Care, Other (non HMO) | Attending: Emergency Medicine | Admitting: Emergency Medicine

## 2018-08-29 ENCOUNTER — Emergency Department (HOSPITAL_COMMUNITY): Payer: Managed Care, Other (non HMO)

## 2018-08-29 ENCOUNTER — Encounter (HOSPITAL_COMMUNITY): Payer: Self-pay

## 2018-08-29 DIAGNOSIS — Z87891 Personal history of nicotine dependence: Secondary | ICD-10-CM | POA: Insufficient documentation

## 2018-08-29 DIAGNOSIS — R079 Chest pain, unspecified: Secondary | ICD-10-CM | POA: Diagnosis not present

## 2018-08-29 DIAGNOSIS — Z79899 Other long term (current) drug therapy: Secondary | ICD-10-CM | POA: Diagnosis not present

## 2018-08-29 LAB — CBC
HCT: 44.7 % (ref 39.0–52.0)
Hemoglobin: 14.6 g/dL (ref 13.0–17.0)
MCH: 29.6 pg (ref 26.0–34.0)
MCHC: 32.7 g/dL (ref 30.0–36.0)
MCV: 90.7 fL (ref 80.0–100.0)
Platelets: 247 10*3/uL (ref 150–400)
RBC: 4.93 MIL/uL (ref 4.22–5.81)
RDW: 11.6 % (ref 11.5–15.5)
WBC: 8.9 10*3/uL (ref 4.0–10.5)
nRBC: 0 % (ref 0.0–0.2)

## 2018-08-29 LAB — BASIC METABOLIC PANEL
Anion gap: 10 (ref 5–15)
BUN: 16 mg/dL (ref 6–20)
CO2: 24 mmol/L (ref 22–32)
Calcium: 9.5 mg/dL (ref 8.9–10.3)
Chloride: 105 mmol/L (ref 98–111)
Creatinine, Ser: 1.08 mg/dL (ref 0.61–1.24)
GFR calc Af Amer: >60 mL/min (ref >60)
GFR calc non Af Amer: >60 mL/min (ref >60)
Glucose, Bld: 91 mg/dL (ref 70–99)
Potassium: 3.9 mmol/L (ref 3.5–5.1)
Sodium: 139 mmol/L (ref 135–145)

## 2018-08-29 LAB — I-STAT TROPONIN, ED: Troponin i, poc: 0 ng/mL (ref 0.00–0.08)

## 2018-08-29 MED ORDER — SODIUM CHLORIDE 0.9% FLUSH: 3.0000 mL | Freq: Once | INTRAVENOUS | Status: DC

## 2018-08-29 NOTE — ED Triage Notes (Signed)
Pt endorses left sided chest pain with radiation to the left jaw and shoulder with abd pain going to the back. Denies n/v or dizziness. Hypertensive.

## 2018-08-30 MED ORDER — HYDROCODONE-ACETAMINOPHEN 5-325 MG PO TABS: 1.0000 | ORAL_TABLET | ORAL | 0 refills | Status: DC | PRN

## 2018-08-30 NOTE — Discharge Instructions (Addendum)
Follow up with your doctor for ongoing evaluation of persistent chest pain.

## 2018-08-30 NOTE — ED Provider Notes (Signed)
[facility] Provider Note   CSN: [id number] Arrival date & time: 08/29/18  1826     History   Chief Complaint Chief Complaint  Patient presents with  . Chest Pain    HPI Timothy Coffey is a 33 y.o. male.  Patient to ED for evaluation of left upper chest pain that started 2 months ago. He reports pain everyday that is constant. It is now starting to radiate into his left lateral neck. He reports infrequent paresthesia of the left arm. No SOB, cough, fever. He has seen his PCP x 2 and has had 2 prescriptions for muscle relaxers that are ongoing now and providing no relief. He was given a 7-day prednisone Rx one month ago and noticed no change with this either. He denies identified alleviating or aggravating factors. He is otherwise healthy, nonsmoker. 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   The history is provided by the patient. No language interpreter was used.  Chest Pain  Associated symptoms: no diaphoresis, no fever, no nausea, no shortness of breath, no vomiting and no weakness     Past Medical History:  Diagnosis Date  . Allergy   . Obesity     There are no active problems to display for this patient.   History reviewed. No pertinent surgical history.      Home Medications    Prior to Admission medications   Medication Sig Start Date End Date Taking? Authorizing Provider  baclofen (LIORESAL) 10 MG tablet Take 1 tablet (10 mg total) by mouth at bedtime for 10 days. 08/28/18 09/07/18 Yes Nafziger, Kandee Keenory, NP  Multiple Vitamins-Minerals (ADULT GUMMY PO) Take 1 tablet by mouth daily. MENS ONE DAILY    Yes [provider]  omeprazole (PRILOSEC) 20 MG capsule Take 1 capsule (20 mg total) by mouth daily. Patient taking differently: Take 20 mg by mouth daily as needed (acid reflux).  07/08/18  Yes Nafziger, Kandee Keenory, NP  fluticasone (FLONASE) 50 MCG/ACT nasal spray USE 2 SPRAYS INTO THE NOSE ONCE  DAILY Patient not taking: Reported on 08/30/2018 10/29/15   Shirline FreesNafziger, Cory, NP  methylPREDNISolone (MEDROL DOSEPAK) 4 MG TBPK tablet Take as directed Patient not taking: Reported on 08/30/2018 08/06/18   Shirline FreesNafziger, Cory, NP    Family History Family History  Problem Relation Age of Onset  . Arthritis Father   . Heart disease Father   . Skin cancer Father   . Hypertension Other        Family history   . Hyperlipidemia Other        Family History  . Thyroid disease Mother     Social History Social History   Tobacco Use  . Smoking status: Former Games developermoker  . Smokeless tobacco: Never Used  Substance Use Topics  . Alcohol use: Yes    Alcohol/week: 0.0 standard drinks    Comment: 14 beers a day   . Drug use: No     Allergies   Shellfish allergy   Review of Systems Review of Systems  Constitutional: Negative for chills, diaphoresis and fever.  HENT: Negative.   Respiratory: Negative.  Negative for shortness of breath.   Cardiovascular: Positive for chest pain.  Gastrointestinal: Negative.  Negative for nausea and vomiting.  Musculoskeletal: Negative.  Negative for neck pain (No posterior neck pain - chest pain radiates to left lateral neck).  Skin: Negative.   Neurological: Negative for weakness.       See HPI.  Physical Exam Updated Vital Signs BP 126/81   Pulse (!) 58   Temp 97.9 F (36.6 C) (Oral)   Resp 18   SpO2 99%   Physical Exam Vitals signs and nursing note reviewed.  Constitutional:      Appearance: He is well-developed.  HENT:     Head: Normocephalic.  Neck:     Musculoskeletal: Normal range of motion and neck supple.     Vascular: No carotid bruit.  Cardiovascular:     Rate and Rhythm: Normal rate and regular rhythm.  Pulmonary:     Effort: Pulmonary effort is normal.     Breath sounds: Normal breath sounds. No wheezing, rhonchi or rales.  Chest:     Chest wall: Tenderness (Focal tender to left upper chest inferior to clavicle) present.     Abdominal:     General: Bowel sounds are normal.     Palpations: Abdomen is soft.     Tenderness: There is no abdominal tenderness. There is no guarding or rebound.  Musculoskeletal: Normal range of motion.     Right lower leg: No edema.     Left lower leg: No edema.  Skin:    General: Skin is warm and dry.     Findings: No rash.  Neurological:     Mental Status: He is alert and oriented to person, place, and time.     Sensory: No sensory deficit.     Motor: No weakness.     Coordination: Coordination normal.      ED Treatments / Results  Labs (all labs ordered are listed, but only abnormal results are displayed) Labs Reviewed  BASIC METABOLIC PANEL  CBC  I-STAT TROPONIN, ED   Results for orders placed or performed during the hospital encounter of 08/29/18  Basic metabolic panel  Result Value Ref Range   Sodium 139 135 - 145 mmol/L   Potassium 3.9 3.5 - 5.1 mmol/L   Chloride 105 98 - 111 mmol/L   CO2 24 22 - 32 mmol/L   Glucose, Bld 91 70 - 99 mg/dL   BUN 16 6 - 20 mg/dL   Creatinine, Ser 0.10 0.61 - 1.24 mg/dL   Calcium 9.5 8.9 - 07.1 mg/dL   GFR calc non Af Amer >60 >60 mL/min   GFR calc Af Amer >60 >60 mL/min   Anion gap 10 5 - 15  CBC  Result Value Ref Range   WBC 8.9 4.0 - 10.5 K/uL   RBC 4.93 4.22 - 5.81 MIL/uL   Hemoglobin 14.6 13.0 - 17.0 g/dL   HCT 21.9 75.8 - 83.2 %   MCV 90.7 80.0 - 100.0 fL   MCH 29.6 26.0 - 34.0 pg   MCHC 32.7 30.0 - 36.0 g/dL   RDW 54.9 82.6 - 41.5 %   Platelets 247 150 - 400 K/uL   nRBC 0.0 0.0 - 0.2 %  I-stat troponin, ED  Result Value Ref Range   Troponin i, poc 0.00 0.00 - 0.08 ng/mL   Comment 3             EKG None  Radiology Dg Chest 2 View  Result Date: 08/29/2018 CLINICAL DATA:  33 year old male with episodes of chest pain for 2 months. Pain radiating to the left shoulder and neck with dizziness. EXAM: CHEST - 2 VIEW COMPARISON:  08/15/2018. FINDINGS: Stable somewhat low lung volumes with mild elevation of  the left hemidiaphragm. Normal cardiac size and mediastinal contours. Visualized tracheal air column is within normal limits.  The lungs appear stable and clear. No osseous abnormality identified. Negative visible bowel gas pattern. IMPRESSION: Stable.  No cardiopulmonary abnormality. Electronically Signed   By: Odessa FlemingH  Hall M.D.   On: 08/29/2018 19:51    Procedures Procedures (including critical care time)  Medications Ordered in ED Medications  sodium chloride flush (NS) 0.9 % injection 3 mL (has no administration in time range)     Initial Impression / Assessment and Plan / ED Course  I have reviewed the triage vital signs and the nursing notes.  Pertinent labs & imaging results that were available during my care of the patient were reviewed by me and considered in my medical decision making (see chart for details).    Patient to ED for evaluation of constant, chest pain x 2 months. He came tonight because he "couldn't take it anymore".   Labs, EKG, CXR all negative for acute finding. Pain of complaint is reproducible. DDx: chest wall vs potential cervical radiculopathy. No neurologic deficits.   He reports pending CT abdomen has been scheduled by PCP, indicating ongoing evaluation.  He is felt appropriate for discharge home. Will provide #5 Norco for pain relief. Encouraged PCP follow up for ongoing evaluation.  Final Clinical Impressions(s) / ED Diagnoses   Final diagnoses:  None   1. Nonspecific chest pain  ED Discharge Orders    None       Elpidio AnisUpstill, Shunsuke Granzow, PA-C 08/30/18 0244    Dione BoozeGlick, David, MD 08/30/18 [phone]

## 2018-09-02 ENCOUNTER — Other Ambulatory Visit: Payer: Managed Care, Other (non HMO)

## 2018-09-02 ENCOUNTER — Ambulatory Visit
Admission: RE | Admit: 2018-09-02 | Discharge: 2018-09-02 | Disposition: A | Discharge status: Home / Self Care | Payer: Managed Care, Other (non HMO) | Source: Ambulatory Visit | Attending: Adult Health | Admitting: Adult Health

## 2018-09-02 DIAGNOSIS — R1084 Generalized abdominal pain: Secondary | ICD-10-CM

## 2018-09-02 MED ORDER — IOPAMIDOL (ISOVUE-300) INJECTION 61%
100.0000 mL | Freq: Once | INTRAVENOUS | Status: AC | PRN
  Administered 2018-09-02: 100 mL via INTRAVENOUS

## 2018-09-05 ENCOUNTER — Encounter: Payer: Self-pay | Admitting: Adult Health

## 2018-09-16 ENCOUNTER — Encounter: Payer: Self-pay | Admitting: Adult Health

## 2018-10-03 DIAGNOSIS — M5412 Radiculopathy, cervical region: Secondary | ICD-10-CM

## 2018-10-04 ENCOUNTER — Encounter: Payer: Self-pay | Admitting: Adult Health

## 2018-10-04 ENCOUNTER — Ambulatory Visit: Payer: Managed Care, Other (non HMO) | Admitting: Adult Health

## 2018-10-04 ENCOUNTER — Ambulatory Visit (INDEPENDENT_AMBULATORY_CARE_PROVIDER_SITE_OTHER): Payer: Managed Care, Other (non HMO)

## 2018-10-04 VITALS — BP 118/92 | Temp 97.5°F | Wt 245.0 lb

## 2018-10-04 DIAGNOSIS — M5410 Radiculopathy, site unspecified: Secondary | ICD-10-CM

## 2018-10-04 MED ORDER — OXYCODONE-ACETAMINOPHEN 10-325 MG PO TABS: 1.0000 | ORAL_TABLET | Freq: Three times a day (TID) | ORAL | 0 refills | Status: AC

## 2018-10-04 NOTE — Progress Notes (Signed)
Subjective:    Patient ID: Timothy Coffey, male    DOB: [birth year], 33 y.o.   MRN: [id number]  HPI 33 year old male who  has a past medical history of Allergy and Obesity.  Went to the office today for follow-up regarding left-sided chest pain.  Was originally seen for this issue back in November by another provider.  At this time his pain was described as soreness in the upper bilateral chest.  He had no exertional chest pain, shortness of breath, fevers, malaise, palpitations, or swelling.  EKG showed normal sinus rhythm.  He was originally prescribed course of muscle relaxers which did not help.  Followed up with me about a month later for continued pain, at this time continue to complain of soreness in the bilateral upper chest.  He denied radiating chest pain or shortness of breath.  Additionally he complained of intermittent left flank pain x3 to 4 weeks radiating to left lower quadrant.  He was started on a different muscle relaxer and a course of prednisone.  CT abdomen pelvis with contrast was also ordered, which showed no acute findings.  Three weeks later he presented to the emergency room with the complaint of left upper chest pain that the pain was starting to radiate into his left lateral neck.  He reported infrequent paresthesia of the left arm.  His work-up in the emergency room was negative for acute finding.  Today he reports that he continues to have pain in his left shoulder, neck, and cervical spine. He continues to experience episodes of paresthesia in the left arm ans well as floaters in his vision on the left side. Pain is described as " sharp". He has noticed that pain is worse when sitting for extended periods of time, for example when he is on a plane and traveling to [place] for work. Exercise and ROM make the pain better but does not resolve the pain. Warm showers also help with pain relief.   Review of Systems See HPI   Past Medical History:  Diagnosis Date    . Allergy   . Obesity     Social History   Socioeconomic History  . Marital status: Single    Spouse name: Not on file  . Number of children: Not on file  . Years of education: Not on file  . Highest education level: Not on file  Occupational History  . Occupation: Engineer, site  . Financial resource strain: Not on file  . Food insecurity:    Worry: Not on file    Inability: Not on file  . Transportation needs:    Medical: Not on file    Non-medical: Not on file  Tobacco Use  . Smoking status: Former Games developer  . Smokeless tobacco: Never Used  Substance and Sexual Activity  . Alcohol use: Yes    Alcohol/week: 0.0 standard drinks    Comment: 14 beers a day   . Drug use: No  . Sexual activity: Not on file  Lifestyle  . Physical activity:    Days per week: Not on file    Minutes per session: Not on file  . Stress: Not on file  Relationships  . Social connections:    Talks on phone: Not on file    Gets together: Not on file    Attends religious service: Not on file    Active member of club or organization: Not on file    Attends meetings of clubs or  organizations: Not on file    Relationship status: Not on file  . Intimate partner violence:    Fear of current or ex partner: Not on file    Emotionally abused: Not on file    Physically abused: Not on file    Forced sexual activity: Not on file  Other Topics Concern  . Not on file  Social History Narrative   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[employer]   Married    Two children       He likes to be outside     History reviewed. No pertinent surgical history.  Family History  Problem Relation Age of Onset  . Arthritis Father   . Heart disease Father   . Skin cancer Father   . Hypertension Other        Family history   . Hyperlipidemia Other        Family History  . Thyroid disease Mother     Allergies  Allergen Reactions  . Shellfish Allergy     "abdominal pain"    Current Outpatient  Medications on File Prior to Visit  Medication Sig Dispense Refill  . fluticasone (FLONASE) 50 MCG/ACT nasal spray USE 2 SPRAYS INTO THE NOSE ONCE DAILY 16 g 6  . Multiple Vitamins-Minerals (ADULT GUMMY PO) Take 1 tablet by mouth daily. MENS ONE DAILY     . omeprazole (PRILOSEC) 20 MG capsule Take 1 capsule (20 mg total) by mouth daily. (Patient taking differently: Take 20 mg by mouth daily as needed (acid reflux). ) 90 capsule 1   No current facility-administered medications on file prior to visit.     BP (!) 118/92   Temp (!) 97.5 F (36.4 C)   Wt 245 lb (111.1 kg)   BMI 34.17 kg/m       Objective:   Physical Exam Vitals signs reviewed.  Constitutional:      Appearance: Normal appearance.  Cardiovascular:     Rate and Rhythm: Normal rate and regular rhythm.     Pulses: Normal pulses.     Heart sounds: Normal heart sounds.  Pulmonary:     Effort: Pulmonary effort is normal.     Breath sounds: Normal breath sounds.  Musculoskeletal: Normal range of motion.        General: No swelling, tenderness, deformity or signs of injury.  Skin:    General: Skin is warm and dry.     Capillary Refill: Capillary refill takes less than 2 seconds.  Neurological:     General: No focal deficit present.     Mental Status: He is alert and oriented to person, place, and time.  Psychiatric:        Mood and Affect: Mood normal.        Behavior: Behavior normal.        Thought Content: Thought content normal.        Judgment: Judgment normal.       Assessment & Plan:  1. Radiculitis - No concern for Cardiac or Pulmonary issue. Possible nerve entrapment or spinal stenosis?  - DG Cervical Spine Complete; Future - oxyCODONE-acetaminophen (PERCOCET) 10-325 MG tablet; Take 1 tablet by mouth every 8 (eight) hours for 7 days.  Dispense: 21 tablet; Refill: 0 - DG Lumbar Spine Complete; Future - DG Shoulder Left; Future - Will likely need MRI   Shirline Frees, NP

## 2018-10-04 NOTE — Telephone Encounter (Signed)
Spoke to patient and informed him of his xray results which were negative.   I would like to get an MRI of the cervical spine due to his symptoms

## 2018-10-15 ENCOUNTER — Encounter: Payer: Self-pay | Admitting: Adult Health

## 2018-10-15 ENCOUNTER — Other Ambulatory Visit: Payer: Self-pay | Admitting: Adult Health

## 2018-10-15 DIAGNOSIS — M5412 Radiculopathy, cervical region: Secondary | ICD-10-CM

## 2018-10-21 ENCOUNTER — Encounter: Payer: Self-pay | Admitting: Adult Health

## 2018-10-28 ENCOUNTER — Encounter: Payer: Self-pay | Admitting: Adult Health

## 2018-10-29 ENCOUNTER — Encounter: Payer: Self-pay | Admitting: Adult Health

## 2018-10-29 ENCOUNTER — Other Ambulatory Visit: Payer: Self-pay | Admitting: Adult Health

## 2018-10-29 MED ORDER — HYDROCODONE-ACETAMINOPHEN 5-325 MG PO TABS: 1.0000 | ORAL_TABLET | Freq: Four times a day (QID) | ORAL | 0 refills | Status: AC | PRN

## 2018-10-31 ENCOUNTER — Encounter: Payer: Self-pay | Admitting: Adult Health

## 2018-11-01 ENCOUNTER — Other Ambulatory Visit: Payer: Managed Care, Other (non HMO)

## 2018-11-01 ENCOUNTER — Encounter: Payer: Self-pay | Admitting: Adult Health

## 2018-11-11 ENCOUNTER — Encounter: Payer: Self-pay | Admitting: Adult Health

## 2018-11-12 ENCOUNTER — Encounter: Payer: Self-pay | Admitting: Adult Health

## 2018-11-15 ENCOUNTER — Encounter: Payer: Self-pay | Admitting: Adult Health

## 2018-11-15 ENCOUNTER — Other Ambulatory Visit: Payer: Self-pay | Admitting: Adult Health

## 2018-11-15 MED ORDER — HYDROCODONE-ACETAMINOPHEN 5-325 MG PO TABS: 1.0000 | ORAL_TABLET | Freq: Four times a day (QID) | ORAL | 0 refills | Status: AC | PRN

## 2019-01-01 ENCOUNTER — Encounter: Payer: Self-pay | Admitting: Adult Health

## 2019-05-07 ENCOUNTER — Encounter: Payer: Self-pay | Admitting: Adult Health

## 2019-05-08 ENCOUNTER — Encounter: Payer: Self-pay | Admitting: Adult Health

## 2019-05-08 ENCOUNTER — Ambulatory Visit (INDEPENDENT_AMBULATORY_CARE_PROVIDER_SITE_OTHER): Payer: Managed Care, Other (non HMO) | Admitting: Adult Health

## 2019-05-08 ENCOUNTER — Other Ambulatory Visit: Payer: Self-pay

## 2019-05-08 VITALS — BP 130/92 | HR 71 | Temp 98.3°F | Wt 238.8 lb

## 2019-05-08 DIAGNOSIS — N451 Epididymitis: Secondary | ICD-10-CM

## 2019-05-08 DIAGNOSIS — M545 Low back pain, unspecified: Secondary | ICD-10-CM

## 2019-05-08 DIAGNOSIS — Z760 Encounter for issue of repeat prescription: Secondary | ICD-10-CM | POA: Diagnosis not present

## 2019-05-08 MED ORDER — FLUTICASONE PROPIONATE 50 MCG/ACT NA SUSP: NASAL | 6 refills | Status: DC

## 2019-05-08 MED ORDER — LEVOFLOXACIN 500 MG PO TABS: 500.0000 mg | ORAL_TABLET | Freq: Every day | ORAL | 0 refills | Status: DC

## 2019-05-08 MED ORDER — PREDNISONE 10 MG PO TABS: 10.0000 mg | ORAL_TABLET | Freq: Every day | ORAL | 0 refills | Status: DC

## 2019-05-08 NOTE — Progress Notes (Signed)
Subjective:    Patient ID: Timothy Coffey, male    DOB: 1986-04-20, 33 y.o.   MRN: [id number]  HPI 33 year old male who  has a past medical history of Allergy and Obesity.  33 year old male who is being evaluated today for left-sided testicle pain as well as low back pain.  He reports that his left-sided testicle pain started approximately 1 week ago, he is also noticed some swelling to the left testicle. Pain is described as dull and achy  He denies injury or aggravating factor.  He denies any  new sexual contacts.  He has not experienced dysuria, hematuria, or discharge.  He has no rectal pain and no issues with bowel movements.  Been using over-the-counter pain relieving medication without relief.  Right low back pain started about 3 days ago.  Pain is nonradiating.  He denies lifting heavy objects or trauma to his low back.  Pain is worse with bending and twisting movements.    Review of Systems See HPI   Past Medical History:  Diagnosis Date  . Allergy   . Obesity     Social History   Socioeconomic History  . Marital status: Single    Spouse name: Not on file  . Number of children: Not on file  . Years of education: Not on file  . Highest education level: Not on file  Occupational History  . Occupation: Arboriculturist  . Financial resource strain: Not on file  . Food insecurity    Worry: Not on file    Inability: Not on file  . Transportation needs    Medical: Not on file    Non-medical: Not on file  Tobacco Use  . Smoking status: Former Research scientist (life sciences)  . Smokeless tobacco: Never Used  Substance and Sexual Activity  . Alcohol use: Yes    Alcohol/week: 0.0 standard drinks    Comment: 14 beers a day   . Drug use: No  . Sexual activity: Not on file  Lifestyle  . Physical activity    Days per week: Not on file    Minutes per session: Not on file  . Stress: Not on file  Relationships  . Social Herbalist on phone: Not on file    Gets  together: Not on file    Attends religious service: Not on file    Active member of club or organization: Not on file    Attends meetings of clubs or organizations: Not on file    Relationship status: Not on file  . Intimate partner violence    Fear of current or ex partner: Not on file    Emotionally abused: Not on file    Physically abused: Not on file    Forced sexual activity: Not on file  Other Topics Concern  . Not on file  Social History Narrative   He is an Clinical biochemist for [employer]   Married    Two children       He likes to be outside     History reviewed. No pertinent surgical history.  Family History  Problem Relation Age of Onset  . Arthritis Father   . Heart disease Father   . Skin cancer Father   . Hypertension Other        Family history   . Hyperlipidemia Other        Family History  . Thyroid disease Mother     Allergies  Allergen Reactions  .  Shellfish Allergy     "abdominal pain"    Current Outpatient Medications on File Prior to Visit  Medication Sig Dispense Refill  . Multiple Vitamins-Minerals (ADULT GUMMY PO) Take 1 tablet by mouth daily. MENS ONE DAILY     . omeprazole (PRILOSEC) 20 MG capsule Take 1 capsule (20 mg total) by mouth daily. (Patient taking differently: Take 20 mg by mouth daily as needed (acid reflux). ) 90 capsule 1   No current facility-administered medications on file prior to visit.     BP (!) 130/92 (BP Location: Left Arm, Patient Position: Sitting, Cuff Size: Large)   Pulse 71   Temp 98.3 F (36.8 C) (Temporal)   Wt 238 lb 12.8 oz (108.3 kg)   SpO2 99%   BMI 33.31 kg/m        Objective:   Physical Exam Vitals signs and nursing note reviewed.  Constitutional:      Comments: Appears in discomfort  Cardiovascular:     Rate and Rhythm: Normal rate and regular rhythm.     Pulses: Normal pulses.     Heart sounds: Normal heart sounds.  Pulmonary:     Effort: Pulmonary effort is normal.     Breath sounds:  Normal breath sounds.  Genitourinary:    Penis: Normal.      Scrotum/Testes:        Left: Tenderness and swelling present. Testicular hydrocele or varicocele not present.     Epididymis:     Left: Inflamed and enlarged. Tenderness present. No mass.  Musculoskeletal: Normal range of motion.        General: Tenderness (musclar pain to right lower back ) present. No swelling, deformity or signs of injury.     Right lower leg: No edema.     Left lower leg: No edema.  Skin:    General: Skin is warm and dry.     Capillary Refill: Capillary refill takes less than 2 seconds.  Neurological:     General: No focal deficit present.     Mental Status: He is alert and oriented to person, place, and time.  Psychiatric:        Mood and Affect: Mood normal.        Behavior: Behavior normal.        Thought Content: Thought content normal.        Judgment: Judgment normal.       Assessment & Plan:  1. Epididymitis - No concern for torsion. No masses noted on testicle.  - levofloxacin (LEVAQUIN) 500 MG tablet; Take 1 tablet (500 mg total) by mouth daily.  Dispense: 10 tablet; Refill: 0 - Continue with NSAIDS - Follow up if no improvement in the next 2-3 days  2. Acute bilateral low back pain without sciatica  - predniSONE (DELTASONE) 10 MG tablet; Take 1 tablet (10 mg total) by mouth daily with breakfast.  Dispense: 7 tablet; Refill: 0  3. Medication refill  - fluticasone (FLONASE) 50 MCG/ACT nasal spray; USE 2 SPRAYS INTO THE NOSE ONCE DAILY  Dispense: 16 g; Refill: 6   Shirline Frees, NP

## 2019-05-08 NOTE — Patient Instructions (Addendum)
It was great seeing you today   Your scrotal pain is coming from a bacterial infection called Epididymitis. I have prescribed a antibiotic for this.   I have also sent in prednisone for the swelling and low back pain   Please let me know if you are not feeling any better.     Epididymitis  Epididymitis is swelling (inflammation) or infection of the epididymis. The epididymis is a cord-like structure that is located along the top and back part of the testicle. It collects and stores sperm from the testicle. This condition can also cause pain and swelling of the testicle and scrotum. Symptoms usually start suddenly (acute epididymitis). Sometimes epididymitis starts gradually and lasts for a while (chronic epididymitis). This type may be harder to treat. What are the causes? In men ages 81-40, this condition is usually caused by a bacterial infection or a sexually transmitted disease (STD), such as:  Gonorrhea.  Chlamydia. In men 1 and older who do not have anal sex, this condition is usually caused by bacteria from a blockage or from abnormalities in the urinary system. These can result from:  Having a tube placed into the bladder (urinary catheter).  Having an enlarged or inflamed prostate gland.  Having recently had urinary tract surgery.  Having a problem with a backward flow of urine (retrograde). In men who have a condition that weakens the body's defense system (immune system), such as HIV, this condition can be caused by:  Other bacteria, including tuberculosis and syphilis.  Viruses.  Fungi. Sometimes this condition occurs without infection. This may happen because of trauma or repetitive activities such as sports. What increases the risk? You are more likely to develop this condition if you have:  Unprotected sex with more than one partner.  Anal sex.  Recently had surgery.  A urinary catheter.  Urinary problems.  A suppressed immune system. What are the  signs or symptoms? This condition usually begins suddenly with chills, fever, and pain behind the scrotum and in the testicle. Other symptoms include:  Swelling of the scrotum, testicle, or both.  Pain when ejaculating or urinating.  Pain in the back or abdomen.  Nausea.  Itching and discharge from the penis.  A frequent need to pass urine.  Redness, increased warmth, and tenderness of the scrotum. How is this diagnosed? Your health care provider can diagnose this condition based on your symptoms and medical history. Your health care provider will also do a physical exam to ask about your symptoms and check your scrotum and testicle for swelling, pain, and redness. You may also have other tests, including:  Examination of discharge from the penis.  Urine tests for infections, such as STDs.  Ultrasound test for blood flow and inflammation. Your health care provider may test you for other STDs, including HIV. How is this treated? Treatment for this condition depends on the cause. If your condition is caused by a bacterial infection, oral antibiotic medicine may be prescribed. If the bacterial infection has spread to your blood, you may need to receive IV antibiotics. For both bacterial and nonbacterial epididymitis, you may be treated with:  Rest.  Elevation of the scrotum.  Pain medicines.  Anti-inflammatory medicines. Surgery may be needed to treat:  Bacterial epididymitis that causes pus to build up in the scrotum (abscess).  Chronic epididymitis that has not responded to other treatments. Follow these instructions at home: Medicines  Take over-the-counter and prescription medicines only as told by your health care provider.  If you were prescribed an antibiotic medicine, take it as told by your health care provider. Do not stop taking the antibiotic even if your condition improves. Sexual activity  If your epididymitis was caused by an STD, avoid sexual activity  until your treatment is complete.  Inform your sexual partner or partners if you test positive for an STD. They may need to be treated. Do not engage in sexual activity with your partner or partners until their treatment is completed. Managing pain and swelling   If directed, elevate your scrotum and apply ice. ? Put ice in a plastic bag. ? Place a small towel or pillow between your legs. ? Rest your scrotum on the pillow or towel. ? Place another towel between your skin and the plastic bag. ? Leave the ice on for 20 minutes, 2-3 times a day.  Try taking a sitz bath to help with discomfort. This is a warm water bath that is taken while you are sitting down. The water should only come up to your hips and should cover your buttocks. Do this 3-4 times per day or as told by your health care provider.  Keep your scrotum elevated and supported while resting. Ask your health care provider if you should wear a scrotal support, such as a jockstrap. Wear it as told by your health care provider. General instructions  Return to your normal activities as told by your health care provider. Ask your health care provider what activities are safe for you.  Drink enough fluid to keep your urine pale yellow.  Keep all follow-up visits as told by your health care provider. This is important. Contact a health care provider if:  You have a fever.  Your pain medicine is not helping.  Your pain is getting worse.  Your symptoms do not improve within 3 days. Summary  Epididymitis is swelling (inflammation) or infection of the epididymis. This condition can also cause pain and swelling of the testicle and scrotum.  Treatment for this condition depends on the cause. If your condition is caused by a bacterial infection, oral antibiotic medicine may be prescribed.  Inform your sexual partner or partners if you test positive for an STD. They may need to be treated. Do not engage in sexual activity with your  partner or partners until their treatment is completed.  Contact a health care provider if your symptoms do not improve within 3 days. This information is not intended to replace advice given to you by your health care provider. Make sure you discuss any questions you have with your health care provider. Document Released: 07/28/2000 Document Revised: 06/03/2018 Document Reviewed: 06/04/2018 Elsevier Patient Education  2020 [employer].

## 2019-05-19 ENCOUNTER — Encounter: Payer: Self-pay | Admitting: Adult Health

## 2019-05-20 ENCOUNTER — Encounter: Payer: Self-pay | Admitting: Adult Health

## 2019-05-20 ENCOUNTER — Other Ambulatory Visit: Payer: Self-pay | Admitting: Adult Health

## 2019-05-20 DIAGNOSIS — N451 Epididymitis: Secondary | ICD-10-CM

## 2019-05-20 MED ORDER — NAPROXEN 500 MG PO TABS: 500.0000 mg | ORAL_TABLET | Freq: Three times a day (TID) | ORAL | 0 refills | Status: AC

## 2019-05-20 NOTE — Progress Notes (Signed)
US

## 2019-06-06 ENCOUNTER — Ambulatory Visit
Admission: RE | Admit: 2019-06-06 | Discharge: 2019-06-06 | Disposition: A | Discharge status: Home / Self Care | Payer: Managed Care, Other (non HMO) | Source: Ambulatory Visit | Attending: Adult Health | Admitting: Adult Health

## 2019-06-06 DIAGNOSIS — N451 Epididymitis: Secondary | ICD-10-CM

## 2019-07-22 ENCOUNTER — Encounter: Payer: Self-pay | Admitting: Adult Health

## 2019-10-15 ENCOUNTER — Encounter: Payer: Self-pay | Admitting: Adult Health

## 2019-10-31 ENCOUNTER — Other Ambulatory Visit: Payer: Self-pay | Admitting: Adult Health

## 2019-10-31 DIAGNOSIS — Z760 Encounter for issue of repeat prescription: Secondary | ICD-10-CM

## 2019-10-31 NOTE — Telephone Encounter (Signed)
90 day supply sent to the pharmacy.  Pt has upcoming cpx.   

## 2019-12-26 ENCOUNTER — Encounter: Payer: Managed Care, Other (non HMO) | Admitting: Adult Health

## 2020-01-20 ENCOUNTER — Other Ambulatory Visit: Payer: Self-pay

## 2020-01-20 ENCOUNTER — Encounter: Payer: Self-pay | Admitting: Adult Health

## 2020-01-20 ENCOUNTER — Ambulatory Visit (INDEPENDENT_AMBULATORY_CARE_PROVIDER_SITE_OTHER): Payer: Managed Care, Other (non HMO) | Admitting: Adult Health

## 2020-01-20 VITALS — BP 126/88 | Temp 97.9°F | Ht 71.0 in | Wt 219.0 lb

## 2020-01-20 DIAGNOSIS — E669 Obesity, unspecified: Secondary | ICD-10-CM | POA: Diagnosis not present

## 2020-01-20 DIAGNOSIS — Z0000 Encounter for general adult medical examination without abnormal findings: Secondary | ICD-10-CM

## 2020-01-20 DIAGNOSIS — K2100 Gastro-esophageal reflux disease with esophagitis, without bleeding: Secondary | ICD-10-CM

## 2020-01-20 MED ORDER — KETOCONAZOLE 2 % EX CREA: 1.0000 "application " | TOPICAL_CREAM | Freq: Every day | CUTANEOUS | 1 refills | Status: DC

## 2020-01-20 MED ORDER — CYCLOBENZAPRINE HCL 10 MG PO TABS: 10.0000 mg | ORAL_TABLET | Freq: Three times a day (TID) | ORAL | 0 refills | Status: DC | PRN

## 2020-01-20 NOTE — Progress Notes (Signed)
Subjective:    Patient ID: Timothy Coffey, male    DOB: 09-Jun-1986, 34 y.o.   MRN: [id number]  HPI Patient presents for yearly preventative medicine examination. He is a pleasant 34 year old male who  has a past medical history of Allergy and Obesity.  Seasonal Allergies - uses OTC medications   GERD - takes Prilosec PRN   All immunizations and health maintenance protocols were reviewed with the patient and needed orders were placed. He is up to date   Appropriate screening laboratory values were ordered for the patient including screening of hyperlipidemia, renal function and hepatic function.  Medication reconciliation,  past medical history, social history, problem list and allergies were reviewed in detail with the patient  Goals were established with regard to weight loss, exercise, and  diet in compliance with medications. He has been running 2-3 miles a day and is eating healthier with smaller portions. He has been able to lose about  Wt Readings from Last 3 Encounters:  01/20/20 219 lb (99.3 kg)  05/08/19 238 lb 12.8 oz (108.3 kg)  10/04/18 245 lb (111.1 kg)   He has no acute issues   Review of Systems  Constitutional: Negative.   HENT: Negative.   Eyes: Negative.   Respiratory: Negative.   Cardiovascular: Negative.   Gastrointestinal: Negative.   Endocrine: Negative.   Genitourinary: Negative.   Musculoskeletal: Negative.   Skin: Negative.   Allergic/Immunologic: Negative.   Neurological: Negative.   Hematological: Negative.   Psychiatric/Behavioral: Negative.   All other systems reviewed and are negative.  Past Medical History:  Diagnosis Date  . Allergy   . Obesity     Social History   Socioeconomic History  . Marital status: Married    Spouse name: Not on file  . Number of children: Not on file  . Years of education: Not on file  . Highest education level: Not on file  Occupational History  . Occupation: Personnel officer  Tobacco Use  . Smoking  status: Former Games developer  . Smokeless tobacco: Never Used  Substance and Sexual Activity  . Alcohol use: Yes    Alcohol/week: 0.0 standard drinks    Comment: 14 beers a day   . Drug use: No  . Sexual activity: Not on file  Other Topics Concern  . Not on file  Social History Narrative   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[employer]   Married    Two children       He likes to be outside    Social Determinants of Corporate investment banker Strain:   . Difficulty of Paying Living Expenses:   Food Insecurity:   . Worried About Programme researcher, broadcasting/film/video in the Last Year:   . Barista in the Last Year:   Transportation Needs:   . Freight forwarder (Medical):   Marland Kitchen Lack of Transportation (Non-Medical):   Physical Activity:   . Days of Exercise per Week:   . Minutes of Exercise per Session:   Stress:   . Feeling of Stress :   Social Connections:   . Frequency of Communication with Friends and Family:   . Frequency of Social Gatherings with Friends and Family:   . Attends Religious Services:   . Active Member of Clubs or Organizations:   . Attends Banker Meetings:   Marland Kitchen Marital Status:   Intimate Partner Violence:   . Fear of Current or Ex-Partner:   . Emotionally Abused:   .  Physically Abused:   . Sexually Abused:     History reviewed. No pertinent surgical history.  Family History  Problem Relation Age of Onset  . Arthritis Father   . Heart disease Father   . Skin cancer Father   . Hypertension Other        Family history   . Hyperlipidemia Other        Family History  . Thyroid disease Mother     Allergies  Allergen Reactions  . Shellfish Allergy     "abdominal pain"    Current Outpatient Medications on File Prior to Visit  Medication Sig Dispense Refill  . fluticasone (FLONASE) 50 MCG/ACT nasal spray USE 2 SPRAYS INTO THE NOSE ONCE DAILY 48 mL 0  . Multiple Vitamins-Minerals (ADULT GUMMY PO) Take 1 tablet by mouth daily. MENS ONE DAILY     .  omeprazole (PRILOSEC) 20 MG capsule Take 1 capsule (20 mg total) by mouth daily. (Patient taking differently: Take 20 mg by mouth daily as needed (acid reflux). ) 90 capsule 1   No current facility-administered medications on file prior to visit.    BP 126/88   Temp 97.9 F (36.6 C) (Temporal)   Ht 5\' 11"  (1.803 m)   Wt 219 lb (99.3 kg)   BMI 30.54 kg/m       Objective:   Physical Exam Vitals and nursing note reviewed.  Constitutional:      General: He is not in acute distress.    Appearance: Normal appearance. He is well-developed. He is obese.  HENT:     Head: Normocephalic and atraumatic.     Right Ear: Tympanic membrane, ear canal and external ear normal. There is no impacted cerumen.     Left Ear: Tympanic membrane, ear canal and external ear normal. There is no impacted cerumen.     Nose: Nose normal. No congestion or rhinorrhea.     Mouth/Throat:     Mouth: Mucous membranes are moist.     Pharynx: Oropharynx is clear. No oropharyngeal exudate or posterior oropharyngeal erythema.  Eyes:     General:        Right eye: No discharge.        Left eye: No discharge.     Extraocular Movements: Extraocular movements intact.     Conjunctiva/sclera: Conjunctivae normal.     Pupils: Pupils are equal, round, and reactive to light.  Neck:     Vascular: No carotid bruit.     Trachea: No tracheal deviation.  Cardiovascular:     Rate and Rhythm: Normal rate and regular rhythm.     Pulses: Normal pulses.     Heart sounds: Normal heart sounds. No murmur. No friction rub. No gallop.   Pulmonary:     Effort: Pulmonary effort is normal. No respiratory distress.     Breath sounds: Normal breath sounds. No stridor. No wheezing, rhonchi or rales.  Chest:     Chest wall: No tenderness.  Abdominal:     General: Bowel sounds are normal. There is no distension.     Palpations: Abdomen is soft. There is no mass.     Tenderness: There is no abdominal tenderness. There is no right CVA  tenderness, left CVA tenderness, guarding or rebound.     Hernia: No hernia is present.  Musculoskeletal:        General: No swelling, tenderness, deformity or signs of injury. Normal range of motion.     Right lower leg: No edema.  Left lower leg: No edema.  Lymphadenopathy:     Cervical: No cervical adenopathy.  Skin:    General: Skin is warm and dry.     Capillary Refill: Capillary refill takes less than 2 seconds.     Coloration: Skin is not jaundiced or pale.     Findings: No bruising, erythema, lesion or rash.  Neurological:     General: No focal deficit present.     Mental Status: He is alert and oriented to person, place, and time.     Cranial Nerves: No cranial nerve deficit.     Sensory: No sensory deficit.     Motor: No weakness.     Coordination: Coordination normal.     Gait: Gait normal.     Deep Tendon Reflexes: Reflexes normal.  Psychiatric:        Mood and Affect: Mood normal.        Behavior: Behavior normal.        Thought Content: Thought content normal.        Judgment: Judgment normal.       Assessment & Plan:  1. Routine general medical examination at a health care facility - Follow up in one year or sooner if needed - CBC with Differential/Platelet; Future - Comprehensive metabolic panel; Future - Hemoglobin A1c; Future - Lipid panel; Future - TSH; Future  2. Gastroesophageal reflux disease with esophagitis without hemorrhage - Continue with Prilosec PRN   3. Obesity (BMI 30-39.9) - Congratulated on weight loss through diet and exercise  - CBC with Differential/Platelet; Future - Comprehensive metabolic panel; Future - Hemoglobin A1c; Future - Lipid panel; Future - TSH; Future   Dorothyann Peng, NP

## 2020-01-20 NOTE — Patient Instructions (Signed)
It was great seeing you today   Go to the [facility] office for labs. - it is in the basement   I will follow up with you once I get your labs back

## 2020-01-23 ENCOUNTER — Other Ambulatory Visit (INDEPENDENT_AMBULATORY_CARE_PROVIDER_SITE_OTHER): Payer: Managed Care, Other (non HMO)

## 2020-01-23 ENCOUNTER — Emergency Department (HOSPITAL_COMMUNITY): Payer: Managed Care, Other (non HMO)

## 2020-01-23 ENCOUNTER — Observation Stay (HOSPITAL_COMMUNITY)
Admission: EM | Admit: 2020-01-23 | Discharge: 2020-01-26 | Disposition: A | Discharge status: Home / Self Care | Payer: Managed Care, Other (non HMO) | Attending: Student | Admitting: Student

## 2020-01-23 ENCOUNTER — Encounter (HOSPITAL_COMMUNITY): Payer: Self-pay | Admitting: Emergency Medicine

## 2020-01-23 ENCOUNTER — Other Ambulatory Visit: Payer: Self-pay

## 2020-01-23 DIAGNOSIS — R202 Paresthesia of skin: Secondary | ICD-10-CM | POA: Diagnosis not present

## 2020-01-23 DIAGNOSIS — E669 Obesity, unspecified: Secondary | ICD-10-CM

## 2020-01-23 DIAGNOSIS — R410 Disorientation, unspecified: Secondary | ICD-10-CM | POA: Diagnosis not present

## 2020-01-23 DIAGNOSIS — G40909 Epilepsy, unspecified, not intractable, without status epilepticus: Secondary | ICD-10-CM

## 2020-01-23 DIAGNOSIS — R001 Bradycardia, unspecified: Secondary | ICD-10-CM | POA: Diagnosis not present

## 2020-01-23 DIAGNOSIS — Z7982 Long term (current) use of aspirin: Secondary | ICD-10-CM | POA: Diagnosis not present

## 2020-01-23 DIAGNOSIS — F1010 Alcohol abuse, uncomplicated: Secondary | ICD-10-CM | POA: Insufficient documentation

## 2020-01-23 DIAGNOSIS — Z20822 Contact with and (suspected) exposure to covid-19: Secondary | ICD-10-CM | POA: Insufficient documentation

## 2020-01-23 DIAGNOSIS — K219 Gastro-esophageal reflux disease without esophagitis: Secondary | ICD-10-CM | POA: Diagnosis present

## 2020-01-23 DIAGNOSIS — R079 Chest pain, unspecified: Secondary | ICD-10-CM | POA: Diagnosis present

## 2020-01-23 DIAGNOSIS — G9340 Encephalopathy, unspecified: Secondary | ICD-10-CM

## 2020-01-23 DIAGNOSIS — R569 Unspecified convulsions: Secondary | ICD-10-CM

## 2020-01-23 DIAGNOSIS — Z79899 Other long term (current) drug therapy: Secondary | ICD-10-CM | POA: Diagnosis not present

## 2020-01-23 DIAGNOSIS — Z0000 Encounter for general adult medical examination without abnormal findings: Secondary | ICD-10-CM | POA: Diagnosis not present

## 2020-01-23 DIAGNOSIS — R253 Fasciculation: Principal | ICD-10-CM | POA: Insufficient documentation

## 2020-01-23 DIAGNOSIS — Z87891 Personal history of nicotine dependence: Secondary | ICD-10-CM | POA: Insufficient documentation

## 2020-01-23 HISTORY — DX: Gastro-esophageal reflux disease without esophagitis: K21.9

## 2020-01-23 LAB — LIPID PANEL
Cholesterol: 175 mg/dL (ref 0–200)
HDL: 44.3 mg/dL (ref >39.00)
LDL Cholesterol: 120 mg/dL — ABNORMAL HIGH (ref 0–99)
NonHDL: 130.59
Total CHOL/HDL Ratio: 4
Triglycerides: 55 mg/dL (ref 0.0–149.0)
VLDL: 11 mg/dL (ref 0.0–40.0)

## 2020-01-23 LAB — BASIC METABOLIC PANEL
Anion gap: 10 (ref 5–15)
BUN: 18 mg/dL (ref 6–20)
CO2: 24 mmol/L (ref 22–32)
Calcium: 9.4 mg/dL (ref 8.9–10.3)
Chloride: 102 mmol/L (ref 98–111)
Creatinine, Ser: 1.06 mg/dL (ref 0.61–1.24)
GFR calc Af Amer: >60 mL/min (ref >60)
GFR calc non Af Amer: >60 mL/min (ref >60)
Glucose, Bld: 103 mg/dL — ABNORMAL HIGH (ref 70–99)
Potassium: 3.5 mmol/L (ref 3.5–5.1)
Sodium: 136 mmol/L (ref 135–145)

## 2020-01-23 LAB — CBC WITH DIFFERENTIAL/PLATELET
Basophils Absolute: 0 10*3/uL (ref 0.0–0.1)
Basophils Relative: 0.4 % (ref 0.0–3.0)
Eosinophils Absolute: 0.1 10*3/uL (ref 0.0–0.7)
Eosinophils Relative: 2.9 % (ref 0.0–5.0)
HCT: 42.8 % (ref 39.0–52.0)
Hemoglobin: 14.7 g/dL (ref 13.0–17.0)
Lymphocytes Relative: 40.9 % (ref 12.0–46.0)
Lymphs Abs: 1.9 10*3/uL (ref 0.7–4.0)
MCHC: 34.4 g/dL (ref 30.0–36.0)
MCV: 90.1 fl (ref 78.0–100.0)
Monocytes Absolute: 0.4 10*3/uL (ref 0.1–1.0)
Monocytes Relative: 8.4 % (ref 3.0–12.0)
Neutro Abs: 2.2 10*3/uL (ref 1.4–7.7)
Neutrophils Relative %: 47.4 % (ref 43.0–77.0)
Platelets: 196 10*3/uL (ref 150.0–400.0)
RBC: 4.75 Mil/uL (ref 4.22–5.81)
RDW: 12.7 % (ref 11.5–15.5)
WBC: 4.6 10*3/uL (ref 4.0–10.5)

## 2020-01-23 LAB — COMPREHENSIVE METABOLIC PANEL
ALT: 15 U/L (ref 0–53)
AST: 13 U/L (ref 0–37)
Albumin: 4.8 g/dL (ref 3.5–5.2)
Alkaline Phosphatase: 49 U/L (ref 39–117)
BUN: 13 mg/dL (ref 6–23)
CO2: 28 mEq/L (ref 19–32)
Calcium: 9.3 mg/dL (ref 8.4–10.5)
Chloride: 102 mEq/L (ref 96–112)
Creatinine, Ser: 0.95 mg/dL (ref 0.40–1.50)
GFR: 91.6 mL/min (ref >60.00)
Glucose, Bld: 94 mg/dL (ref 70–99)
Potassium: 4 mEq/L (ref 3.5–5.1)
Sodium: 135 mEq/L (ref 135–145)
Total Bilirubin: 1.1 mg/dL (ref 0.2–1.2)
Total Protein: 7.7 g/dL (ref 6.0–8.3)

## 2020-01-23 LAB — TROPONIN I (HIGH SENSITIVITY): Troponin I (High Sensitivity): <2 ng/L (ref <18)

## 2020-01-23 LAB — TSH: TSH: 0.42 u[IU]/mL (ref 0.35–4.50)

## 2020-01-23 NOTE — ED Triage Notes (Addendum)
Pt was walking dog and came back inside afterwards and started having non-radiating chest pain so he called EMS. Pt has 324 baby ASA prior to EMS arrival. Per EMS, pt was hyperventilating with RR 30's and HR in 130's.  Pt also c/o numbness to bilateral arms and R side of face. Pt states this is not new to him as it has been "coming and going for years". Pt does however state that "this time feels different" in that the right side of his face "feels more droopy and numb".

## 2020-01-24 ENCOUNTER — Encounter (HOSPITAL_COMMUNITY): Payer: Self-pay | Admitting: Internal Medicine

## 2020-01-24 ENCOUNTER — Emergency Department (HOSPITAL_COMMUNITY): Payer: Managed Care, Other (non HMO)

## 2020-01-24 ENCOUNTER — Observation Stay (HOSPITAL_COMMUNITY): Payer: Managed Care, Other (non HMO)

## 2020-01-24 DIAGNOSIS — R001 Bradycardia, unspecified: Secondary | ICD-10-CM | POA: Diagnosis not present

## 2020-01-24 DIAGNOSIS — G40909 Epilepsy, unspecified, not intractable, without status epilepticus: Secondary | ICD-10-CM | POA: Diagnosis not present

## 2020-01-24 DIAGNOSIS — F1010 Alcohol abuse, uncomplicated: Secondary | ICD-10-CM | POA: Diagnosis not present

## 2020-01-24 DIAGNOSIS — R410 Disorientation, unspecified: Secondary | ICD-10-CM | POA: Diagnosis not present

## 2020-01-24 DIAGNOSIS — R202 Paresthesia of skin: Secondary | ICD-10-CM | POA: Diagnosis not present

## 2020-01-24 DIAGNOSIS — Z20822 Contact with and (suspected) exposure to covid-19: Secondary | ICD-10-CM | POA: Diagnosis not present

## 2020-01-24 DIAGNOSIS — R253 Fasciculation: Secondary | ICD-10-CM | POA: Diagnosis not present

## 2020-01-24 DIAGNOSIS — Z79899 Other long term (current) drug therapy: Secondary | ICD-10-CM | POA: Diagnosis not present

## 2020-01-24 DIAGNOSIS — E669 Obesity, unspecified: Secondary | ICD-10-CM | POA: Diagnosis present

## 2020-01-24 DIAGNOSIS — K219 Gastro-esophageal reflux disease without esophagitis: Secondary | ICD-10-CM | POA: Diagnosis present

## 2020-01-24 DIAGNOSIS — Z87891 Personal history of nicotine dependence: Secondary | ICD-10-CM | POA: Diagnosis not present

## 2020-01-24 DIAGNOSIS — Z7982 Long term (current) use of aspirin: Secondary | ICD-10-CM | POA: Diagnosis not present

## 2020-01-24 DIAGNOSIS — R079 Chest pain, unspecified: Secondary | ICD-10-CM | POA: Diagnosis present

## 2020-01-24 LAB — CBC WITH DIFFERENTIAL/PLATELET
Abs Immature Granulocytes: 0.05 10*3/uL (ref 0.00–0.07)
Basophils Absolute: 0 10*3/uL (ref 0.0–0.1)
Basophils Relative: 0 %
Eosinophils Absolute: 0.1 10*3/uL (ref 0.0–0.5)
Eosinophils Relative: 1 %
HCT: 40.7 % (ref 39.0–52.0)
Hemoglobin: 14.1 g/dL (ref 13.0–17.0)
Immature Granulocytes: 1 %
Lymphocytes Relative: 17 %
Lymphs Abs: 1.5 10*3/uL (ref 0.7–4.0)
MCH: 31 pg (ref 26.0–34.0)
MCHC: 34.6 g/dL (ref 30.0–36.0)
MCV: 89.5 fL (ref 80.0–100.0)
Monocytes Absolute: 0.5 10*3/uL (ref 0.1–1.0)
Monocytes Relative: 6 %
Neutro Abs: 6.7 10*3/uL (ref 1.7–7.7)
Neutrophils Relative %: 75 %
Platelets: 221 10*3/uL (ref 150–400)
RBC: 4.55 MIL/uL (ref 4.22–5.81)
RDW: 11.9 % (ref 11.5–15.5)
WBC: 9 10*3/uL (ref 4.0–10.5)
nRBC: 0 % (ref 0.0–0.2)

## 2020-01-24 LAB — CK: Total CK: 80 U/L (ref 49–397)

## 2020-01-24 LAB — SARS CORONAVIRUS 2 BY RT PCR (HOSPITAL ORDER, PERFORMED IN ~~LOC~~ HOSPITAL LAB): SARS Coronavirus 2: NEGATIVE

## 2020-01-24 LAB — D-DIMER, QUANTITATIVE: D-Dimer, Quant: <0.27 ug/mL-FEU (ref 0.00–0.50)

## 2020-01-24 LAB — TROPONIN I (HIGH SENSITIVITY): Troponin I (High Sensitivity): <2 ng/L (ref <18)

## 2020-01-24 IMAGING — CT CT HEAD W/O CM
3 series · 16 of 47 positions shown, 19 images · non-contrast
Comparison: None.

CLINICAL DATA: Altered mental status

EXAM:
CT HEAD WITHOUT CONTRAST
TECHNIQUE: Contiguous axial images were obtained from the base of the skull
through the vertex without intravenous contrast.

[Series 2: head w o · axial · 0.43mm/px · z∈[+1469,+1594]mm · 10 of 31 slices shown, 13 images]
[im 3/31  brain]
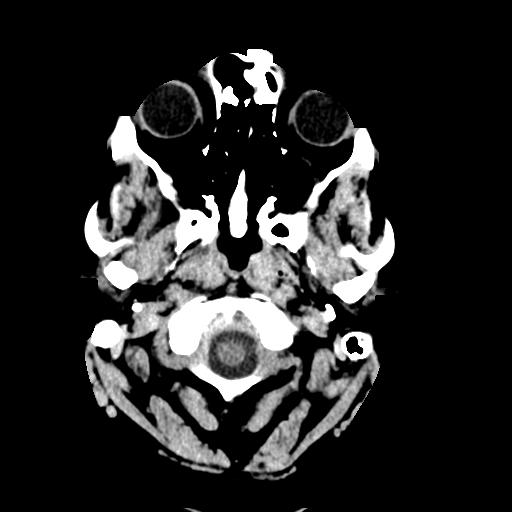
[im 3/31  bone]
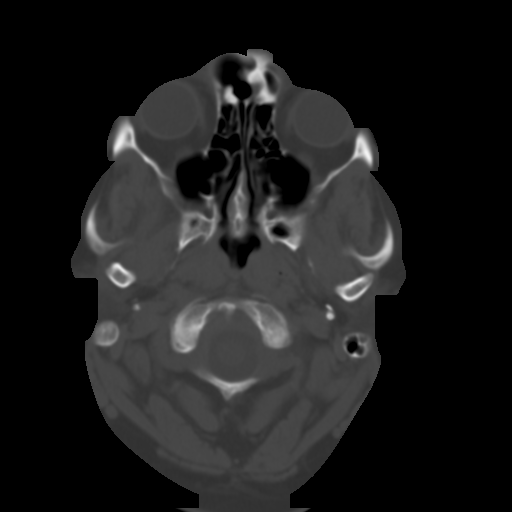
[im 6/31  brain]
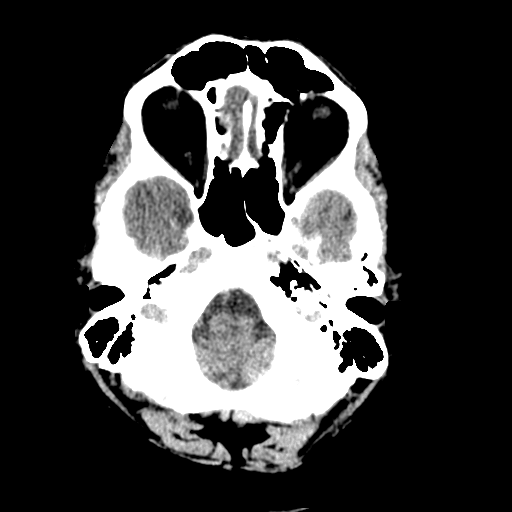
[im 9/31  brain]
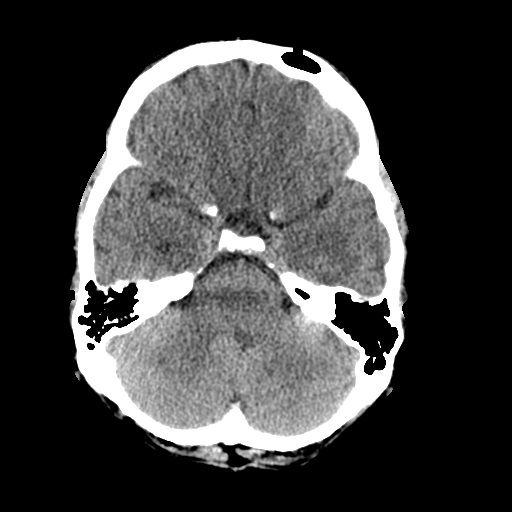
[im 11/31  brain]
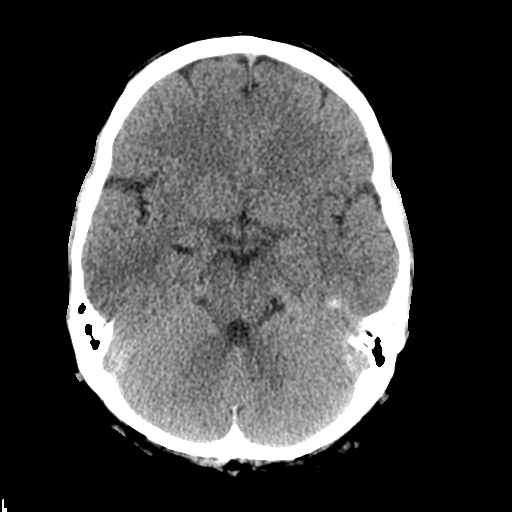
[im 14/31  brain]
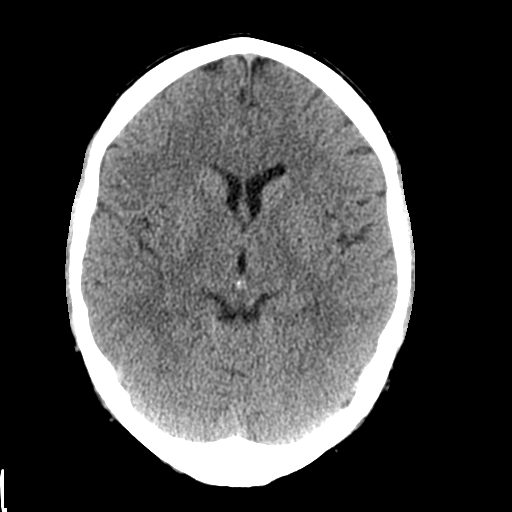
[im 14/31  bone]
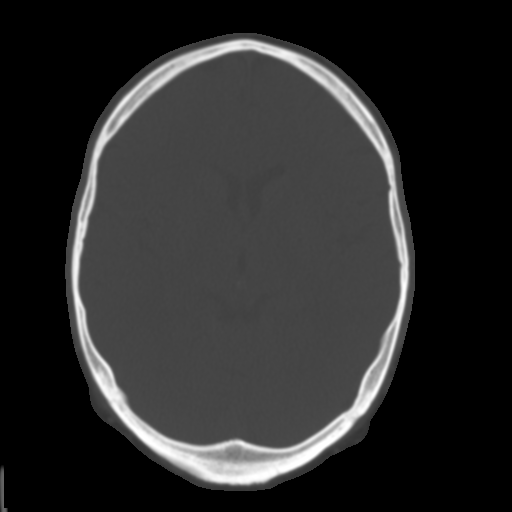
[im 17/31  brain]
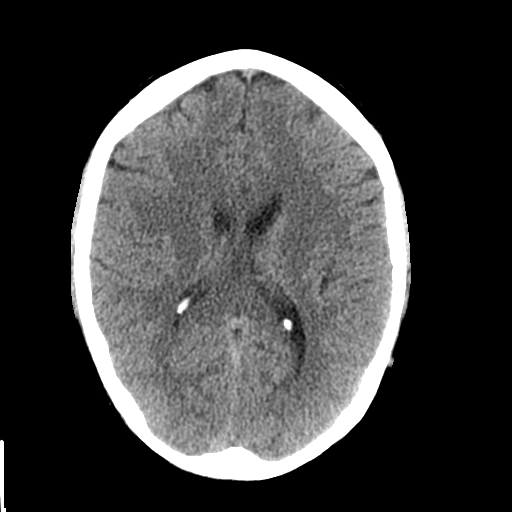
[im 20/31  brain]
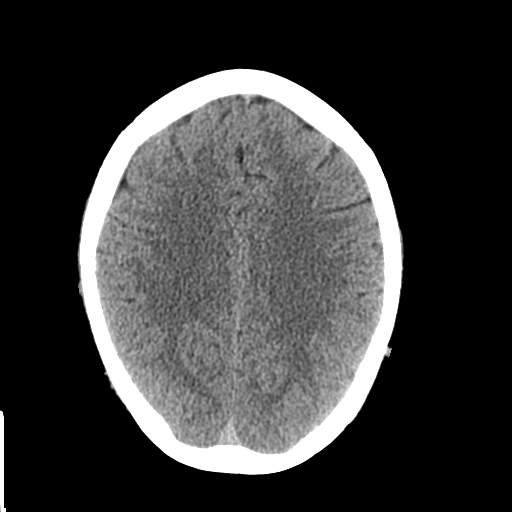
[im 23/31  brain]
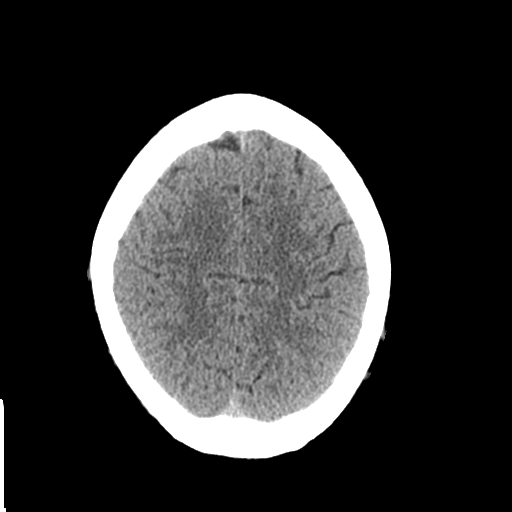
[im 25/31  brain]
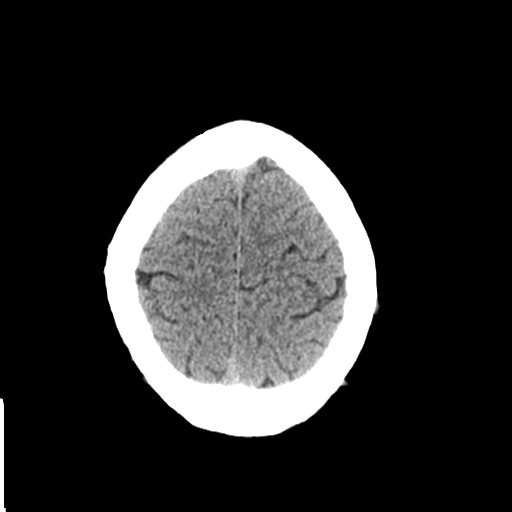
[im 25/31  bone]
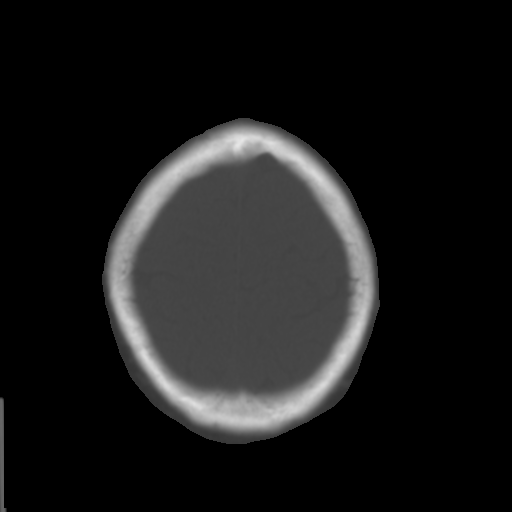
[im 28/31  brain]
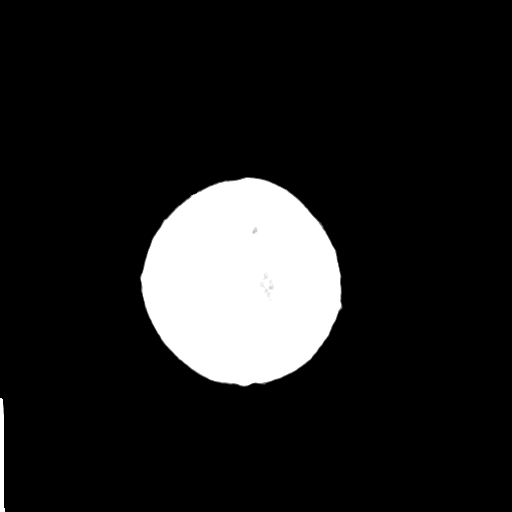

[Series 4: coronal soft · coronal · 0.35mm/px · 3 of 76 slices shown]
[im 26/76  brain]
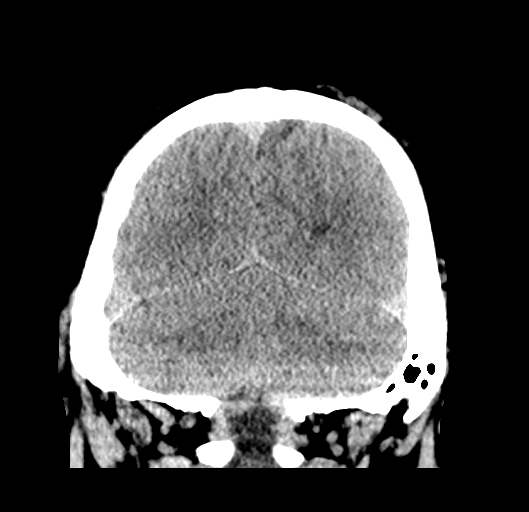
[im 34/76  brain]
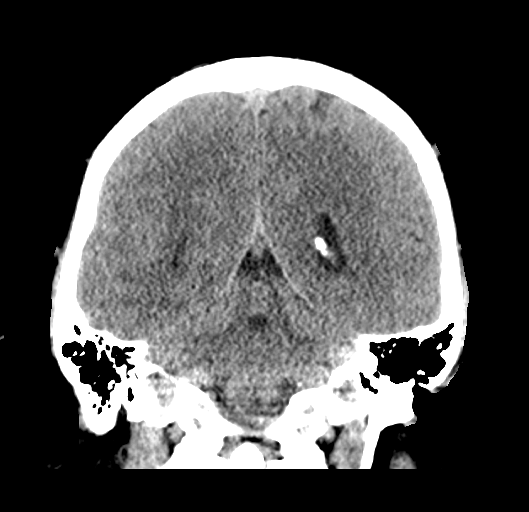
[im 42/76  brain]
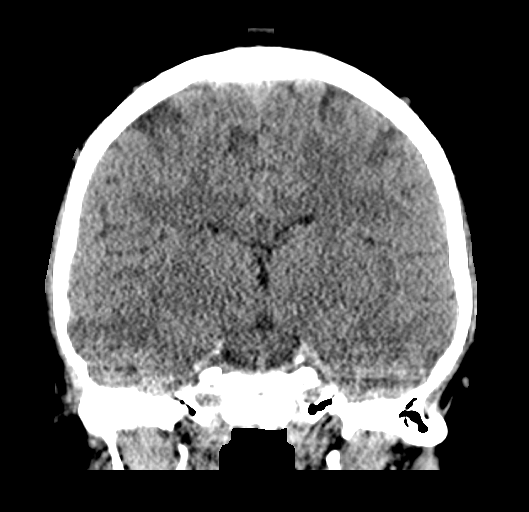

[Series 5: sagittal soft · sagittal · 0.35mm/px · 3 of 58 slices shown]
[im 20/58  brain]
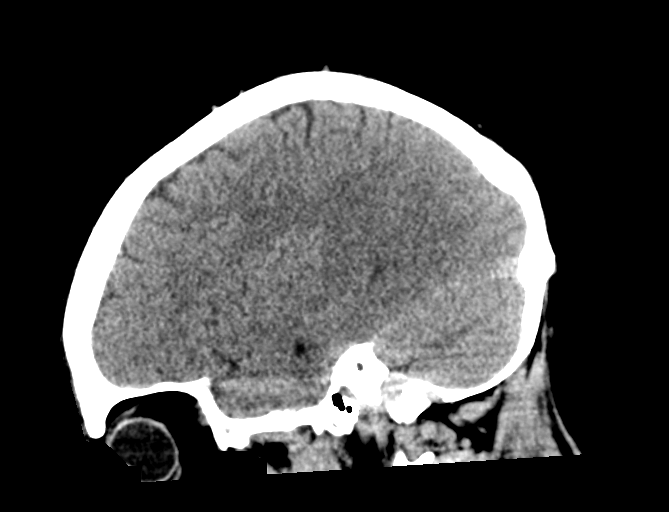
[im 29/58  brain]
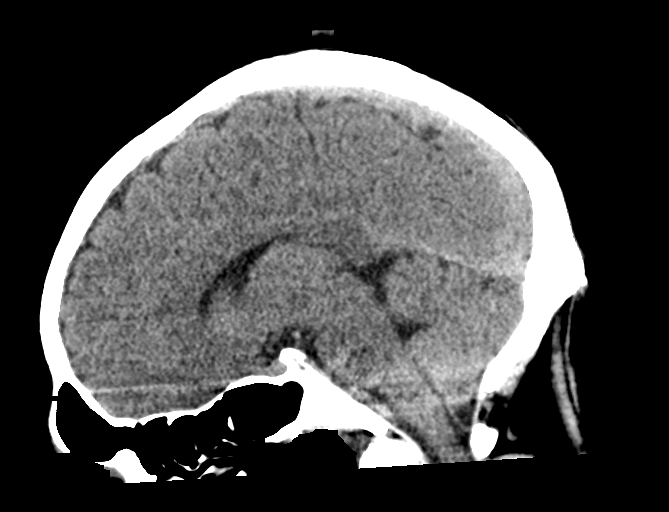
[im 39/58  brain]
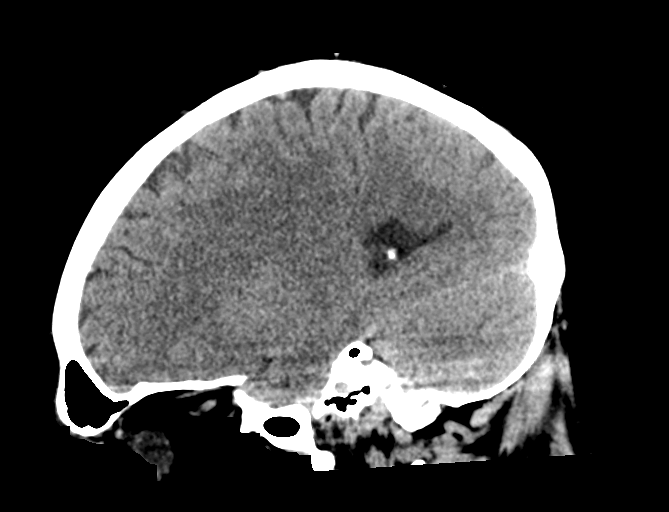

[16 of 47 positions shown; findings below may reference images not displayed]

FINDINGS: Brain: No acute intracranial abnormality. Specifically, no
hemorrhage, hydrocephalus, mass lesion, acute infarction, or
significant intracranial injury.

Vascular: No hyperdense vessel or unexpected calcification.

Skull: No acute calvarial abnormality.

Sinuses/Orbits: Visualized paranasal sinuses and mastoids clear.
Orbital soft tissues unremarkable.

Other: None
IMPRESSION: Normal study.

## 2020-01-24 MED ORDER — POTASSIUM CHLORIDE IN NACL 20-0.9 MEQ/L-% IV SOLN
INTRAVENOUS | Status: DC
  Filled 2020-01-24: qty 1000

## 2020-01-24 MED ORDER — ACETAMINOPHEN 325 MG PO TABS: 650.0000 mg | ORAL_TABLET | Freq: Four times a day (QID) | ORAL | Status: DC | PRN

## 2020-01-24 MED ORDER — LORAZEPAM 2 MG/ML IJ SOLN: 2.0000 mg | INTRAMUSCULAR | Status: DC | PRN

## 2020-01-24 MED ORDER — ENOXAPARIN SODIUM 40 MG/0.4ML ~~LOC~~ SOLN
40.0000 mg | SUBCUTANEOUS | Status: DC
  Administered 2020-01-24: 40 mg via SUBCUTANEOUS
  Filled 2020-01-24: qty 0.4

## 2020-01-24 MED ORDER — ACETAMINOPHEN 650 MG RE SUPP: 650.0000 mg | Freq: Four times a day (QID) | RECTAL | Status: DC | PRN

## 2020-01-24 NOTE — Progress Notes (Signed)
Per HPI: Timothy Coffey is a 34 y.o. male with medical history significant of obesity, seasonal allergies, GERD who is coming to the emergency department due to developing chest pain, but this was precluded by patient being confused when he came back home from walking his dog.  His significant other described that he had a blank stare for about 10 to 15 minutes and was not answering questions or acting appropriately.  He is to sequentially start her responding, but then was twitching his face and arms.  This lasted for several hours and then EMS call.  EMS described the patient as restless, tachypneic and tachycardic complaining of bilateral arm and facial sides numbness.  Upon arrival to the ER, the patient was already feeling better and was oriented x3.  He has been having pain on and off nonpleuritic, nonradiating chest pain.  He stated that he is able to exercise without significant discomfort.  He denies fever, chills, sore throat, rhinorrhea, cough, wheezing or hemoptysis.  Denies palpitations, dizziness, diaphoresis, PND, orthopnea or pitting edema of the lower extremities.  No abdominal pain, nausea, emesis, diarrhea, constipation, melena or hematochezia.  No dysuria, frequency or hematuria.  No polyuria, polydipsia, polyphagia or blurred vision.  ED Course: Initial vital signs temperature 98 F, pulse 83, respiration 17, BP 141/91 mmHg and O2 sat 98% on room air.  He had a teleneurology consult while in the ED.  CBC was normal.  D-dimer less than 0.27.  EKG sinus rhythm.  Troponin x2 were normal.  Total CK was 80.  BMP shows a nonfasting glucose of 103 mg/dL, but was otherwise normal.  Chest radiograph and CT head without contrast did not have any acute abnormalities.  Please see images and full radiology report for further detail.  -Patient seen and evaluated this a.m. in the ED and denies any complaints or concerns.  He does state that he fell and hit his head approximately 1 month prior when  he was at [facility].  He has been placed on Ativan as needed for any seizure activity.  Patient to go to [facility] for further evaluation with brain MRI as well as EEG as well as neurohospitalist consultation.  He is in stable condition for transfer.  Total care time: 30 minutes.

## 2020-01-24 NOTE — ED Notes (Signed)
Patient states that his back is hurting and that the left side is still feeling numb at this time. Says that it goes from side to side at times.

## 2020-01-24 NOTE — Consult Note (Addendum)
NEURO HOSPITALIST CONSULT NOTE   Requesting physician: Dr. Sherryll Burger   Reason for Consult:staring spell    History obtained from:  Patient  / girlfriend HPI:                                                                                                                                          Timothy Coffey is an 34 y.o. male with PMH obesity and GERD who transferred from [facility] with c/o "staring spell".    Patient states that he only remembers taking the dog for a walk. Girlfriend at bedside supplements history. She stated that he came back from walking the dog and had just drank a glass of water. He was sitting in the chair and just had this blank stare across his face for about 45 minutes. He did not talk during this time just a blank stare. Then started talking some. His hands and feet were both numb and he could not get out of the chair. EMS was called. At this time he began shaking both arms and both feet. His eyes were open and he was able to say only " I don't know". No loss of consciousness, no loss of bowel or bladder function. This has never happened before and he denies ever having febrile seizures as a child. He did state that about 3 times he has had episodes where he seems to have lost time. He states he will lay down in bed at about 0900 and then suddenly it will be 11pm, and he has no memory as to what happened between 9 and 11. Denies blurred vision, weakness. Endorses facial tingling on the right side, socially drinking. Denies smoking or drug use.  He does also state that his face would intermittently tingle. This started about 1 year ago, but then stopped and has recently been happening about once per week for the last two months. The tingling can be on the right or left side of face or both sides at the same time.    CTH: normal BP: 122/82 BG: 103 Past Medical History:  Diagnosis Date  . Allergy   . GERD (gastroesophageal reflux disease)   . Obesity      History reviewed. No pertinent surgical history.  Family History  Problem Relation Age of Onset  . Arthritis Father   . Heart disease Father   . Skin cancer Father   . Hypertension Other        Family history   . Hyperlipidemia Other        Family History  . Thyroid disease Mother    Social History:  reports that he has quit smoking. He has never used smokeless tobacco. He reports current alcohol use. He reports that he does not use drugs.  Allergies  Allergen Reactions  . Shellfish Allergy     "abdominal pain"    MEDICATIONS:                                                                                                                     Scheduled: . enoxaparin (LOVENOX) injection  40 mg Subcutaneous Q24H   Continuous:  FUX:NATFTDDUKGURK **OR** acetaminophen, LORazepam   ROS:                                                                                                                                       ROS was performed and is negative except as noted in HPI    Blood pressure 122/82, pulse (!) 58, temperature 98.5 F (36.9 C), temperature source Oral, resp. rate 18, height 5\' 11"  (1.803 m), weight 99.3 kg, SpO2 98 %.   General Examination:                                                                                                       Physical Exam  Constitutional: Appears well-developed and well-nourished.  Psych: Affect appropriate to situation Eyes: Normal external eye and conjunctiva. HENT: Normocephalic, no lesions, without obvious abnormality.   Musculoskeletal-no joint tenderness, deformity or swelling Cardiovascular: Normal rate and regular rhythm.  Respiratory: Effort normal, non-labored breathing saturations WNL GI: Soft.  No distension. There is no tenderness.  Skin: WDI  Neurological Examination Mental Status: Alert, oriented, thought content appropriate.  Speech fluent without evidence of aphasia.  Able to follow  commands without  difficulty. Cranial Nerves: II: Visual fields grossly normal,  III,IV, VI: ptosis not present, extra-ocular motions intact bilaterally pupils equal, round, reactive to light and accommodation V,VII: smile symmetric, facial light touch sensation normal bilaterally VIII: hearing normal bilaterally IX,X: uvula rises symmetrically XI: bilateral shoulder shrug XII: midline tongue extension Motor: Right : Upper extremity   5/5  Left:     Upper extremity   5/5  Lower extremity   5/5  Lower extremity  5/5 Tone and bulk:normal tone throughout; no atrophy noted Sensory: decreased sensation right face, arm and leg Deep Tendon Reflexes: 2+ and symmetric biceps and patella Cerebellar:  no ataxia noted Gait: deferred   Lab Results: Basic Metabolic Panel: Recent Labs  Lab 01/23/20 2235  NA 136  K 3.5  CL 102  CO2 24  GLUCOSE 103*  BUN 18  CREATININE 1.06  CALCIUM 9.4    CBC: Recent Labs  Lab 01/23/20 2318  WBC 9.0  NEUTROABS 6.7  HGB 14.1  HCT 40.7  MCV 89.5  PLT 221    Cardiac Enzymes: Recent Labs  Lab 01/24/20 0156  CKTOTAL 80    Imaging: DG Chest 2 View  Result Date: 01/23/2020 CLINICAL DATA:  Chest pain EXAM: CHEST - 2 VIEW COMPARISON:  08/29/2018 FINDINGS: The heart size and mediastinal contours are within normal limits. Both lungs are clear. The visualized skeletal structures are unremarkable. IMPRESSION: No active cardiopulmonary disease. Electronically Signed   By: Constance Holster M.D.   On: 01/23/2020 22:42   CT Head Wo Contrast  Result Date: 01/24/2020 CLINICAL DATA:  Altered mental status EXAM: CT HEAD WITHOUT CONTRAST TECHNIQUE: Contiguous axial images were obtained from the base of the skull through the vertex without intravenous contrast. COMPARISON:  None. FINDINGS: Brain: No acute intracranial abnormality. Specifically, no hemorrhage, hydrocephalus, mass lesion, acute infarction, or significant intracranial injury. Vascular: No hyperdense vessel or  unexpected calcification. Skull: No acute calvarial abnormality. Sinuses/Orbits: Visualized paranasal sinuses and mastoids clear. Orbital soft tissues unremarkable. Other: None IMPRESSION: Normal study. Electronically Signed   By: Rolm Baptise M.D.   On: 01/24/2020 03:21    Assessment: 34 yo male with history of obesity and GERD who presented to [facility] with CP and staring spell. Patient was transferred to [facility] for MRI and EEG. Given the history this episode does not sound characteristic of seizure, but will obtain MRI and EEG. Will not start on AED at this time.    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  He is back to baseline with nonfocal neurological exam, other than some numbness on his bilateral face.  His wife has video of the event, he has quasirhythmic shaking of his right arm which he interrupts to scratch his nose, followed by shaking on the contralateral side.  He is staring straight ahead.    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  Roland Rack, MD [facility] [phone]  If 7pm- 7am, please page neurology on call as listed in [place].

## 2020-01-24 NOTE — Consult Note (Signed)
[facility] Tele[facility]urology Consult Services  Stat Consult  Date of Service:   01/24/2020 02:16:43  Impression:     .  R56.9 - Seizures  Comments/Sign-Out: The patient is a 34 year old man with a history of migraines who presents with confessional episode concerning for seizure or possible transient global amnesia. Recommend MRI brain without contrast and EEG. No antiepileptic drugs for now. Bilateral numbness complaints nonspecific, not concerning for CVA.  CT HEAD: Reviewed Normal study  Metrics: [facility] Notification Time: 01/24/2020 02:14:54 Stamp Time: 01/24/2020 02:16:43 Callback Response Time: 01/24/2020 02:18:08  Our recommendations are outlined below.  Imaging Studies:     .  MRI Head Without Contrast  Sign Out:     .  Discussed with Emergency Department Provider  ----------------------------------------------------------------------------------------------------  Chief Complaint: staring spell  History of Present Illness: Patient is a 34 year old Male.  Patient presents with staring spell and confusion. Came back from walking dog, noted by wife to appear dazed. Patient remembers sitting down in house and then doesn't remember what happened afterwards. Wife states that he kept saying "I don't know," and after half an hour noted that he was having low amplitude tremors in both hands, and complaining of bilateral hand and feet and face numbness. Said that he couldn't walk and was complaining of migraine. EMS called. Patient says that he remembers leaving house with EMS and memory back to baseline in hospital. Still complaints of bilateral hand numbness and numbness of entire face. Has had intermittent numbness of the face for few weeks, no prior episodes of confusion. Past medical history includes migraines   Past Medical History:     . There is NO history of Hypertension     . There is NO history of Diabetes Mellitus     . There is NO  history of Hyperlipidemia     . There is NO history of Atrial Fibrillation     . There is NO history of Coronary Artery Disease     . There is NO history of Stroke  Anticoagulant use:  No  Antiplatelet use: No    Examination: BP(118/82), Pulse(54), Blood Glucose(103) 1A: Level of Consciousness - Alert; keenly responsive + 0 1B: Ask Month and Age - Both Questions Right + 0 1C: Blink Eyes & Squeeze Hands - Performs Both Tasks + 0 2: Test Horizontal Extraocular Movements - Normal + 0 3: Test Visual Fields - No Visual Loss + 0 4: Test Facial Palsy (Use Grimace if Obtunded) - Normal symmetry + 0 5A: Test Left Arm Motor Drift - No Drift for 10 Seconds + 0 5B: Test Right Arm Motor Drift - No Drift for 10 Seconds + 0 6A: Test Left Leg Motor Drift - No Drift for 5 Seconds + 0 6B: Test Right Leg Motor Drift - No Drift for 5 Seconds + 0 7: Test Limb Ataxia (FNF/Heel-Shin) - No Ataxia + 0 8: Test Sensation - Mild-Moderate Loss: Less Sharp/More Dull + 1 9: Test Language/Aphasia - Normal; No aphasia + 0 10: Test Dysarthria - Normal + 0 11: Test Extinction/Inattention - No abnormality + 0  NIHSS Score: 1    Patient is being evaluated for possible acute neurologic impairment and high probability of imminent or life-threatening deterioration. I spent total of 25 minutes providing care to this patient, including time for face to face visit via telemedicine, review of medical records, imaging studies and discussion of findings with providers, the patient and/or family.   Dr Serita Grammes  [facility] [phone]  Case [id number]

## 2020-01-24 NOTE — ED Provider Notes (Signed)
[facility] EMERGENCY DEPARTMENT Provider Note   CSN: [id number] Arrival date & time: 01/23/20  2201     History Chief Complaint  Patient presents with  . Chest Pain    Timothy Coffey is a 34 y.o. male.  Patient here by EMS with change in mental status and twitching activity and numbness.  History assisted by his significant other at bedside.  Patient was normal up until 821 last night.  This time he took the dog out for a walk.  When he came back inside, his significant other said he was not acting correctly had a blank look on his face was not responding to her for about 10 or 15 minutes.  After this he was responding but started to have twitching in his bilateral face and arms.  This lasted for several hours.  EMS was called.  On their arrival patient was hyperventilating and tachycardic.  He was complaining of bilateral numbness to his arms and right and left face.  He is still having the numbness in his hands and face alternating side to side.  He is awake and alert and answering questions appropriately.  He is oriented x3.  He had some chest pain earlier which she has had in the past and gets quite frequently.  Is not exertional or pleuritic.  Significant other at bedside states his speech impediment is at baseline.  He has intermittent numbness to his left side has bilateral arms.  His chest pain has improved.  There is no shortness of breath, cough or fever.  Denies any alcohol or drug use.  Still feels like the right side of his face does not feel normal. No history of seizures.  The history is provided by the patient, the EMS personnel and a relative.  Chest Pain Associated symptoms: dizziness, numbness, shortness of breath and weakness   Associated symptoms: no abdominal pain, no fever, no headache, no nausea and no vomiting        Past Medical History:  Diagnosis Date  . Allergy   . GERD (gastroesophageal reflux disease)   . Obesity     There are no problems to display  for this patient.   History reviewed. No pertinent surgical history.     Family History  Problem Relation Age of Onset  . Arthritis Father   . Heart disease Father   . Skin cancer Father   . Hypertension Other        Family history   . Hyperlipidemia Other        Family History  . Thyroid disease Mother     Social History   Tobacco Use  . Smoking status: Former Games developer  . Smokeless tobacco: Never Used  Vaping Use  . Vaping Use: Never used  Substance Use Topics  . Alcohol use: Yes    Alcohol/week: 0.0 standard drinks    Comment: 14 beers a day   . Drug use: No    Home Medications Prior to Admission medications   Medication Sig Start Date End Date Taking? Authorizing Provider  aspirin EC 325 MG tablet Take 325 mg by mouth once.   Yes [provider]  cyclobenzaprine (FLEXERIL) 10 MG tablet Take 1 tablet (10 mg total) by mouth 3 (three) times daily as needed for muscle spasms. 01/20/20  Yes Nafziger, Kandee Keen, NP  fluticasone (FLONASE) 50 MCG/ACT nasal spray USE 2 SPRAYS INTO THE NOSE ONCE DAILY Patient taking differently: Place 2 sprays into both nostrils daily.  10/31/19  Yes  Nafziger, Tommi Rumps, NP  ketoconazole (NIZORAL) 2 % cream Apply 1 application topically daily. 01/20/20  Yes Nafziger, Tommi Rumps, NP    Allergies    Shellfish allergy  Review of Systems   Review of Systems  Constitutional: Negative for activity change, appetite change and fever.  HENT: Negative for congestion and rhinorrhea.   Respiratory: Positive for chest tightness and shortness of breath.   Cardiovascular: Positive for chest pain. Negative for leg swelling.  Gastrointestinal: Negative for abdominal pain, nausea and vomiting.  Genitourinary: Negative for dysuria and hematuria.  Musculoskeletal: Negative for arthralgias and myalgias.  Skin: Negative for rash.  Neurological: Positive for dizziness, weakness, light-headedness and numbness. Negative for headaches.   all other systems are negative  except as noted in the HPI and PMH.    Physical Exam Updated Vital Signs BP 121/78   Pulse (!) 54   Temp 98 F (36.7 C) (Oral)   Resp (!) 23   Ht 5\' 11"  (1.803 m)   Wt 99.3 kg   SpO2 97%   BMI 30.53 kg/m   Physical Exam Vitals and nursing note reviewed.  Constitutional:      General: He is not in acute distress.    Appearance: He is well-developed.     Comments: Mildly dysarthric speech, baseline per significant other.  Oriented to person, place and time.  HENT:     Head: Normocephalic and atraumatic.     Mouth/Throat:     Pharynx: No oropharyngeal exudate.  Eyes:     Conjunctiva/sclera: Conjunctivae normal.     Pupils: Pupils are equal, round, and reactive to light.  Neck:     Comments: No meningismus. Cardiovascular:     Rate and Rhythm: Normal rate and regular rhythm.     Heart sounds: Normal heart sounds. No murmur heard.   Pulmonary:     Effort: Pulmonary effort is normal. No respiratory distress.     Breath sounds: Normal breath sounds.  Abdominal:     Palpations: Abdomen is soft.     Tenderness: There is no abdominal tenderness. There is no guarding or rebound.  Musculoskeletal:        General: No tenderness. Normal range of motion.     Cervical back: Normal range of motion and neck supple.  Skin:    General: Skin is warm.  Neurological:     General: No focal deficit present.     Mental Status: He is alert and oriented to person, place, and time.     Cranial Nerves: No cranial nerve deficit.     Motor: No abnormal muscle tone.     Coordination: Coordination normal.     Comments:  5/5 strength throughout. CN 2-12 intact.Equal grip strength.  Subjective paresthesias to left face and left arm.  Equal grip strength throughout, no ataxia finger-to-nose, cranial nerves II to XII intact without facial droop.  Tongue is midline. No weakness in arms or legs.  Psychiatric:        Behavior: Behavior normal.     ED Results / Procedures / Treatments   Labs (all  labs ordered are listed, but only abnormal results are displayed) Labs Reviewed  BASIC METABOLIC PANEL - Abnormal; Notable for the following components:      Result Value   Glucose, Bld 103 (*)    All other components within normal limits  SARS CORONAVIRUS 2 BY RT PCR (HOSPITAL ORDER, [facility])  CBC WITH DIFFERENTIAL/PLATELET  D-DIMER, QUANTITATIVE (NOT AT [facility])  CK  TROPONIN I (HIGH SENSITIVITY)  TROPONIN I (HIGH SENSITIVITY)    EKG EKG Interpretation  Date/Time:  Friday January 23 2020 22:06:31 EDT Ventricular Rate:  82 PR Interval:    QRS Duration: 108 QT Interval:  360 QTC Calculation: 421 R Axis:   77 Text Interpretation: Sinus rhythm No significant change was found Confirmed by Glynn Octave [phone]) on 01/24/2020 2:13:15 AM   Radiology DG Chest 2 View  Result Date: 01/23/2020 CLINICAL DATA:  Chest pain EXAM: CHEST - 2 VIEW COMPARISON:  08/29/2018 FINDINGS: The heart size and mediastinal contours are within normal limits. Both lungs are clear. The visualized skeletal structures are unremarkable. IMPRESSION: No active cardiopulmonary disease. Electronically Signed   By: Katherine Mantle M.D.   On: 01/23/2020 22:42   CT Head Wo Contrast  Result Date: 01/24/2020 CLINICAL DATA:  Altered mental status EXAM: CT HEAD WITHOUT CONTRAST TECHNIQUE: Contiguous axial images were obtained from the base of the skull through the vertex without intravenous contrast. COMPARISON:  None. FINDINGS: Brain: No acute intracranial abnormality. Specifically, no hemorrhage, hydrocephalus, mass lesion, acute infarction, or significant intracranial injury. Vascular: No hyperdense vessel or unexpected calcification. Skull: No acute calvarial abnormality. Sinuses/Orbits: Visualized paranasal sinuses and mastoids clear. Orbital soft tissues unremarkable. Other: None IMPRESSION: Normal study. Electronically Signed   By: Charlett Nose M.D.   On: 01/24/2020 03:21     Procedures Procedures (including critical care time)  Medications Ordered in ED Medications - No data to display  ED Course  I have reviewed the triage vital signs and the nursing notes.  Pertinent labs & imaging results that were available during my care of the patient were reviewed by me and considered in my medical decision making (see chart for details).    MDM Rules/Calculators/A&P                         Episode of change in mental status now improving.  Still having intermittent numbness in his face and bilateral hands.  Bilateral nature argues against stroke.  EKG is sinus rhythm.  CT head is negative.  Troponin negative x2.  D-dimer is negative.  Low suspicion for ACS or PE.  Bilateral nature of numbness argues against stroke seizure is a concern.  Neurology consult obtained who agrees and recommends MRI and EEG. TGA considered as well.  Chest pain is atypical for ACS or PE.  Troponin negative x2 and D-dimer negative.  Code stroke not activated due to improving symptoms of bilateral nature of numbness.  No focal motor deficits.  Patient appears back to baseline per significant other in the room. He still has intermittent paresthesias of his bilateral face and hands.  Is agreeable to overnight observation admission for MRI and EEG.  Discussed no driving until cleared by neurology for at least 6 months. Admission discussed with Dr. Robb Matar Final Clinical Impression(s) / ED Diagnoses Final diagnoses:  Seizure-like activity [facility])  Paresthesia    Rx / DC Orders ED Discharge Orders    None       Ples Trudel, Jeannett Senior, MD 01/24/20 0410

## 2020-01-24 NOTE — H&P (Signed)
History and Physical    Timothy Coffey ZOX:[id number] DOB: February 04, 1986 DOA: 01/23/2020  PCP: Dorothyann Peng, NP   Patient coming from: Home.  I have personally briefly reviewed patient's old medical records in [place]  Chief Complaint: Chest pain.  HPI: Timothy Coffey is a 34 y.o. male with medical history significant of obesity, seasonal allergies, GERD who is coming to the emergency department due to developing chest pain, but this was precluded by patient being confused when he came back home from walking his dog.  His significant other described that he had a blank stare for about 10 to 15 minutes and was not answering questions or acting appropriately.  He is to sequentially start her responding, but then was twitching his face and arms.  This lasted for several hours and then EMS call.  EMS described the patient as restless, tachypneic and tachycardic complaining of bilateral arm and facial sides numbness.  Upon arrival to the ER, the patient was already feeling better and was oriented x3.  He has been having pain on and off nonpleuritic, nonradiating chest pain.  He stated that he is able to exercise without significant discomfort.  He denies fever, chills, sore throat, rhinorrhea, cough, wheezing or hemoptysis.  Denies palpitations, dizziness, diaphoresis, PND, orthopnea or pitting edema of the lower extremities.  No abdominal pain, nausea, emesis, diarrhea, constipation, melena or hematochezia.  No dysuria, frequency or hematuria.  No polyuria, polydipsia, polyphagia or blurred vision.  ED Course: Initial vital signs temperature 98 F, pulse 83, respiration 17, BP 141/91 mmHg and O2 sat 98% on room air.  He had a teleneurology consult while in the ED.  CBC was normal.  D-dimer less than 0.27.  EKG sinus rhythm.  Troponin x2 were normal.  Total CK was 80.  BMP shows a nonfasting glucose of 103 mg/dL, but was otherwise normal.  Chest radiograph and CT head without contrast did not  have any acute abnormalities.  Please see images and full radiology report for further detail.  Review of Systems: As per HPI otherwise all other systems reviewed and are negative.  Past Medical History:  Diagnosis Date  . Allergy   . GERD (gastroesophageal reflux disease)   . Obesity    History reviewed. No pertinent surgical history.  Social History  reports that he has quit smoking. He has never used smokeless tobacco. He reports current alcohol use. He reports that he does not use drugs.  Allergies  Allergen Reactions  . Shellfish Allergy     "abdominal pain"   Family History  Problem Relation Age of Onset  . Arthritis Father   . Heart disease Father   . Skin cancer Father   . Hypertension Other        Family history   . Hyperlipidemia Other        Family History  . Thyroid disease Mother    Prior to Admission medications   Medication Sig Start Date End Date Taking? Authorizing Provider  aspirin EC 325 MG tablet Take 325 mg by mouth once.   Yes [provider]  cyclobenzaprine (FLEXERIL) 10 MG tablet Take 1 tablet (10 mg total) by mouth 3 (three) times daily as needed for muscle spasms. 01/20/20  Yes Nafziger, Tommi Rumps, NP  fluticasone (FLONASE) 50 MCG/ACT nasal spray USE 2 SPRAYS INTO THE NOSE ONCE DAILY Patient taking differently: Place 2 sprays into both nostrils daily.  10/31/19  Yes Nafziger, Tommi Rumps, NP  ketoconazole (NIZORAL) 2 % cream Apply  1 application topically daily. 01/20/20  Yes Nafziger, Kandee Keen, NP   Physical Exam: Vitals:   01/24/20 0430 01/24/20 0500 01/24/20 0530 01/24/20 0600  BP: 106/72 114/82 109/71 (!) 102/54  Pulse: (!) 50 (!) 55 (!) 50 (!) 47  Resp: 17 16 18 16   Temp:      TempSrc:      SpO2: 96% 94% 99% 96%  Weight:      Height:       Constitutional: NAD, calm, comfortable Eyes: PERRL, lids and conjunctivae normal ENMT: Mucous membranes are moist. Posterior pharynx clear of any exudate or lesions. Neck: normal, supple, no masses, no  thyromegaly Respiratory: clear to auscultation bilaterally, no wheezing, no crackles. Normal respiratory effort. No accessory muscle use.  Cardiovascular: Regular rate and rhythm, no murmurs / rubs / gallops. No extremity edema. 2+ pedal pulses. No carotid bruits.  Abdomen: Obese. Bowel sounds positive. Soft, no tenderness, no masses palpated. No hepatosplenomegaly.  Musculoskeletal: no clubbing / cyanosis. Good ROM, no contractures. Normal muscle tone.  Skin: no rashes, lesions, ulcers on limited dermatological examination. Neurologic: CN 2-12 grossly intact. Sensation intact, DTR normal. Strength 5/5 in all 4.  Psychiatric: Normal judgment and insight. Alert and oriented x 3. Normal mood.   Labs on Admission: I have personally reviewed following labs and imaging studies  CBC: Recent Labs  Lab 01/23/20 2318  WBC 9.0  NEUTROABS 6.7  HGB 14.1  HCT 40.7  MCV 89.5  PLT 221   Basic Metabolic Panel: Recent Labs  Lab 01/23/20 2235  NA 136  K 3.5  CL 102  CO2 24  GLUCOSE 103*  BUN 18  CREATININE 1.06  CALCIUM 9.4   GFR: Estimated Creatinine Clearance: 120.1 mL/min (by C-G formula based on SCr of 1.06 mg/dL).  Liver Function Tests: No results for input(s): AST, ALT, ALKPHOS, BILITOT, PROT, ALBUMIN in the last 168 hours.  Urine analysis:    Component Value Date/Time   BILIRUBINUR neg 08/06/2018 0959   PROTEINUR Negative 08/06/2018 0959   UROBILINOGEN 0.2 08/06/2018 0959   NITRITE neg 08/06/2018 0959   LEUKOCYTESUR Negative 08/06/2018 0959   Radiological Exams on Admission: DG Chest 2 View  Result Date: 01/23/2020 CLINICAL DATA:  Chest pain EXAM: CHEST - 2 VIEW COMPARISON:  08/29/2018 FINDINGS: The heart size and mediastinal contours are within normal limits. Both lungs are clear. The visualized skeletal structures are unremarkable. IMPRESSION: No active cardiopulmonary disease. Electronically Signed   By: 08/31/2018 M.D.   On: 01/23/2020 22:42   CT Head Wo  Contrast  Result Date: 01/24/2020 CLINICAL DATA:  Altered mental status EXAM: CT HEAD WITHOUT CONTRAST TECHNIQUE: Contiguous axial images were obtained from the base of the skull through the vertex without intravenous contrast. COMPARISON:  None. FINDINGS: Brain: No acute intracranial abnormality. Specifically, no hemorrhage, hydrocephalus, mass lesion, acute infarction, or significant intracranial injury. Vascular: No hyperdense vessel or unexpected calcification. Skull: No acute calvarial abnormality. Sinuses/Orbits: Visualized paranasal sinuses and mastoids clear. Orbital soft tissues unremarkable. Other: None IMPRESSION: Normal study. Electronically Signed   By: 03/25/2020 M.D.   On: 01/24/2020 03:21   EKG: Independently reviewed. Vent. rate 82 BPM PR interval * ms QRS duration 108 ms QT/QTc 360/421 ms P-R-T axes 32 77 51 Sinus rhythm.  Assessment/Plan Principal Problem:   Seizure disorder (HCC) Transfer to [facility]. Observation/telemetry. Neurochecks every 4 hours. Check MRI of brain. Check EEG. Consult neuro hospitalist.  Active Problems:   Chest pain EKG nonischemic. Troponin normal.  Obesity Advised lifestyle modifications.    GERD (gastroesophageal reflux disease) Occasional symptoms.   Not on PPI.   DVT prophylaxis: Lovenox SQ. Code Status:   Full code. Family Communication:   Disposition Plan:   Patient is from:  Home.  Anticipated DC to:  Home.  Anticipated DC date:  01/25/2020.  Anticipated DC barriers: Neurology consult and further work-up.  Consults called:  Teleneurology. Admission status:  Observation/telemetry.  Severity of Illness:  Bobette Mo MD [facility]  How to contact the [facility] Attending or Consulting provider 7A - 7P or covering provider during after hours 7P -7A, for this patient?   1. Check the care team in [facility] and look for a) attending/consulting [facility] provider listed and b) the [facility] team listed 2. Log into [email or web address] and  use 's universal password to access. If you do not have the password, please contact the hospital operator. 3. Locate the [facility] provider you are looking for under [facility] and page to a number that you can be directly reached. 4. If you still have difficulty reaching the provider, please page the [facility] (Director on Call) for the Hospitalists listed on amion for assistance.  01/24/2020, 6:16 AM   This document was prepared using Dragon voice recognition software and may contain some unintended transcription errors.

## 2020-01-25 ENCOUNTER — Observation Stay (HOSPITAL_COMMUNITY): Payer: Managed Care, Other (non HMO)

## 2020-01-25 DIAGNOSIS — G40909 Epilepsy, unspecified, not intractable, without status epilepticus: Secondary | ICD-10-CM | POA: Diagnosis not present

## 2020-01-25 DIAGNOSIS — R001 Bradycardia, unspecified: Secondary | ICD-10-CM | POA: Diagnosis not present

## 2020-01-25 DIAGNOSIS — F1010 Alcohol abuse, uncomplicated: Secondary | ICD-10-CM

## 2020-01-25 LAB — BASIC METABOLIC PANEL
Anion gap: 10 (ref 5–15)
BUN: 11 mg/dL (ref 6–20)
CO2: 22 mmol/L (ref 22–32)
Calcium: 9.1 mg/dL (ref 8.9–10.3)
Chloride: 105 mmol/L (ref 98–111)
Creatinine, Ser: 0.9 mg/dL (ref 0.61–1.24)
GFR calc Af Amer: >60 mL/min (ref >60)
GFR calc non Af Amer: >60 mL/min (ref >60)
Glucose, Bld: 109 mg/dL — ABNORMAL HIGH (ref 70–99)
Potassium: 3.8 mmol/L (ref 3.5–5.1)
Sodium: 137 mmol/L (ref 135–145)

## 2020-01-25 LAB — HIV ANTIBODY (ROUTINE TESTING W REFLEX): HIV Screen 4th Generation wRfx: NONREACTIVE

## 2020-01-25 MED ORDER — SENNOSIDES-DOCUSATE SODIUM 8.6-50 MG PO TABS: 1.0000 | ORAL_TABLET | Freq: Two times a day (BID) | ORAL | Status: DC | PRN

## 2020-01-25 MED ORDER — POLYETHYLENE GLYCOL 3350 17 G PO PACK: 17.0000 g | PACK | Freq: Two times a day (BID) | ORAL | Status: DC | PRN

## 2020-01-25 NOTE — Progress Notes (Signed)
NEURO HOSPITALIST PROGRESS NOTE   Subjective: Patient awake, alert, NAD. No seizure like episodes overnight. EEG pending. MRI was negative.  Exam: Vitals:   01/24/20 1614 01/24/20 2235  BP: 112/71 105/74  Pulse: (!) 53 (!) 48  Resp: 16 18  Temp: 97.7 F (36.5 C) 97.6 F (36.4 C)  SpO2: 98% 97%    Physical Exam  Constitutional: Appears well-developed and well-nourished.  Psych: Affect appropriate to situation Eyes: Normal external eye and conjunctiva. HENT: Normocephalic, no lesions, without obvious abnormality.   Musculoskeletal-no joint tenderness, deformity or swelling Cardiovascular: Normal rate and regular rhythm.  Respiratory: Effort normal, non-labored breathing saturations WNL GI: Soft.  No distension. There is no tenderness.  Skin: WDI   Neuro:  Mental Status: Alert, oriented, thought content appropriate.  Speech fluent without evidence of aphasia.  Able to follow  commands without difficulty. Cranial Nerves: II:  Visual fields grossly normal,  III,IV, VI: ptosis not present, extra-ocular motions intact bilaterally pupils equal, round, reactive to light and accommodation V,VII: smile symmetric, facial light touch sensation normal bilaterally VIII: hearing normal bilaterally IX,X: uvula rises symmetrically XI: bilateral shoulder shrug XII: midline tongue extension Motor: Right : Upper extremity   5/5  Left:     Upper extremity   5/5  Lower extremity   5/5  \Lower extremity   5/5 Tone and bulk:normal tone throughout; no atrophy noted Sensory: light touch intact throughout, bilaterally Deep Tendon Reflexes: 2+ and symmetric biceps, patella Cerebellar: No ataxia Gait: deferred    Medications:  Scheduled: . enoxaparin (LOVENOX) injection  40 mg Subcutaneous Q24H   Continuous:  GUY:[id number] **OR** acetaminophen, LORazepam  Pertinent Labs/Diagnostics:   DG Chest 2 View  Result Date: 01/23/2020 CLINICAL DATA:  Chest pain EXAM:  CHEST - 2 VIEW COMPARISON:  08/29/2018 FINDINGS: The heart size and mediastinal contours are within normal limits. Both lungs are clear. The visualized skeletal structures are unremarkable. IMPRESSION: No active cardiopulmonary disease. Electronically Signed   By: Constance Holster M.D.   On: 01/23/2020 22:42   DG Abd 1 View  Result Date: 01/25/2020 CLINICAL DATA:  Seizures.  Gastroesophageal reflux EXAM: ABDOMEN - 1 VIEW COMPARISON:  August 06, 2018 FINDINGS: There is moderate stool throughout colon. There is no bowel dilatation or air-fluid level to suggest bowel obstruction. No free air. Visualized lung bases are clear. No abnormal calcifications. IMPRESSION: Moderate stool in colon.  No bowel obstruction or free air. Electronically Signed   By: Lowella Grip III M.D.   On: 01/25/2020 07:54   CT Head Wo Contrast  Result Date: 01/24/2020 CLINICAL DATA:  Altered mental status EXAM: CT HEAD WITHOUT CONTRAST TECHNIQUE: Contiguous axial images were obtained from the base of the skull through the vertex without intravenous contrast. COMPARISON:  None. FINDINGS: Brain: No acute intracranial abnormality. Specifically, no hemorrhage, hydrocephalus, mass lesion, acute infarction, or significant intracranial injury. Vascular: No hyperdense vessel or unexpected calcification. Skull: No acute calvarial abnormality. Sinuses/Orbits: Visualized paranasal sinuses and mastoids clear. Orbital soft tissues unremarkable. Other: None IMPRESSION: Normal study. Electronically Signed   By: Rolm Baptise M.D.   On: 01/24/2020 03:21   MR BRAIN WO CONTRAST  Result Date: 01/24/2020 CLINICAL DATA:  34 year old male with confusion, altered mental status, twitching. EXAM: MRI HEAD WITHOUT CONTRAST TECHNIQUE: Multiplanar, multiecho pulse sequences of the brain and surrounding structures were obtained without intravenous contrast. COMPARISON:  Head CT earlier  today. FINDINGS: Brain: No restricted diffusion to suggest acute  infarction. No midline shift, mass effect, evidence of mass lesion, ventriculomegaly, extra-axial collection or acute intracranial hemorrhage. Cervicomedullary junction and pituitary are within normal limits. Normal cerebral volume. Thin slice coronal images are provided. No hippocampal signal abnormality or asymmetry identified. Wallace Cullens and white matter signal is within normal limits throughout the brain. No encephalomalacia or chronic cerebral blood products. Vascular: Major intracranial vascular flow voids are preserved. Skull and upper cervical spine: Negative visible cervical spine, bone marrow signal. Sinuses/Orbits: Negative orbits. Trace paranasal sinus mucosal thickening. Other: Mastoids are clear. Visible internal auditory structures appear normal. Visible scalp and face soft tissues appear negative. IMPRESSION: Normal noncontrast MRI appearance of the brain. Electronically Signed   By: Odessa Fleming M.D.   On: 01/24/2020 19:45   Assessment: 34 yo male with history of obesity and GERD who presented to [facility] with CP and staring spell. Patient was transferred to [facility] for MRI and EEG. Given the history, and the video this episode does not sound characteristic of seizure and is most likely psychogenic. MRI was negative. EEG is pending.   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

## 2020-01-25 NOTE — Progress Notes (Signed)
PROGRESS NOTE  Timothy Coffey PPI:[id number] DOB: 18-May-1986   PCP: Shirline Frees, NP  Patient is from: Home.  DOA: 01/23/2020 LOS: 0  Brief Narrative / Interim history: 34 y.o.malewith medical history significant ofobesity, seasonal allergies, GERD who is coming to the emergency department due to developing chest pain, but this was precluded by patient being confused when he came back home from walking his dog. His significant other described that he had a blank stare for about 10 to 15 minutes and was not answering questions or acting appropriately. He is to sequentially start her responding, but then was twitching his face and arms. This lasted for several hours and then EMS call.   EMS described the patient as restless, tachypneic and tachycardic complaining of bilateral arm and facial sides numbness. Upon arrival to the ER, the patient was already feeling better and was oriented x3. He has been having pain on and off nonpleuritic, nonradiating chest pain.   At [facility] ED, work-up including vitals, CBC, D-dimer, EKG, troponin, CK, BMP, CXR and CT head without acute finding.  He was transferred to [facility] for MRI, EEG and neurology evaluation.  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  EEG pending.  No AED yet.  Subjective: Seen and examined earlier this morning.  No major events overnight or this morning.  No complaints other than intermittent bilateral tingling in his face this that has been going on for over 2 months.  Objective: Vitals:   01/24/20 1400 01/24/20 1614 01/24/20 2235 01/25/20 1700  BP: 122/82 112/71 105/74 115/72  Pulse: (!) 58 (!) 53 (!) 48 60  Resp: 18 16 18 18   Temp: 98.5 F (36.9 C) 97.7 F (36.5 C) 97.6 F (36.4 C) 97.9 F (36.6 C)  TempSrc: Oral Oral Oral   SpO2: 98% 98% 97% 99%  Weight:      Height:       No intake or output data in the 24 hours ending 01/25/20 1726 Filed Weights   01/23/20 2207  Weight: 99.3 kg     Examination:  GENERAL: No apparent distress.  Nontoxic. HEENT: MMM.  Vision and hearing grossly intact.  NECK: Supple.  No apparent JVD.  RESP:  No IWOB.  Fair aeration bilaterally. CVS:  RRR. Heart sounds normal.  ABD/GI/GU: BS+. Abd soft, NTND.  MSK/EXT:  Moves extremities. No apparent deformity. No edema.  SKIN: no apparent skin lesion or wound NEURO: Awake, alert and oriented appropriately.  No apparent focal neuro deficit. PSYCH: Calm. Normal affect.   Procedures:  None  Microbiology summarized: COVID-19 PCR negative.  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  Work-up including CT head, MRI and basic labs nonrevealing.  Of note, patient reports drinking 6 of the 22 ounce Bud Light beers on Thursday.  Denies history of withdrawal or seizure activity. -Neurology following -EEG pending.  -As needed lorazepam  Alcohol abuse: No withdrawal symptoms. -Closely monitor for withdrawal symptoms -Encouraged moderation or cessation  Sinus bradycardia: HR dropped to 48 overnight but not symptomatic.  EKG without AV block.  Not on nodal blocking agents. -No further work-up.         DVT prophylaxis:  enoxaparin (LOVENOX) injection 40 mg Start: 01/24/20 1400  Code Status: Full code Family Communication: Patient and/or RN. Available if any question.  Status is: Observation  The patient remains OBS appropriate and will d/c before 2 midnights.  Dispo: The patient is from: Home  Anticipated d/c is to: Home              Anticipated d/c date is: 1 day              Patient currently is not medically stable to d/c.       Consultants:  Neurology   Sch Meds:  Scheduled Meds: . enoxaparin (LOVENOX) injection  40 mg Subcutaneous Q24H   Continuous Infusions: PRN Meds:.acetaminophen **OR** acetaminophen, LORazepam  Antimicrobials: Anti-infectives (From admission, onward)   None       I have  personally reviewed the following labs and images: CBC: Recent Labs  Lab 01/23/20 2318  WBC 9.0  NEUTROABS 6.7  HGB 14.1  HCT 40.7  MCV 89.5  PLT 221   BMP &GFR Recent Labs  Lab 01/23/20 2235 01/25/20 0429  NA 136 137  K 3.5 3.8  CL 102 105  CO2 24 22  GLUCOSE 103* 109*  BUN 18 11  CREATININE 1.06 0.90  CALCIUM 9.4 9.1   Estimated Creatinine Clearance: 141.5 mL/min (by C-G formula based on SCr of 0.9 mg/dL). Liver & Pancreas: No results for input(s): AST, ALT, ALKPHOS, BILITOT, PROT, ALBUMIN in the last 168 hours. No results for input(s): LIPASE, AMYLASE in the last 168 hours. No results for input(s): AMMONIA in the last 168 hours. Diabetic: No results for input(s): HGBA1C in the last 72 hours. No results for input(s): GLUCAP in the last 168 hours. Cardiac Enzymes: Recent Labs  Lab 01/24/20 0156  CKTOTAL 80   No results for input(s): PROBNP in the last 8760 hours. Coagulation Profile: No results for input(s): INR, PROTIME in the last 168 hours. Thyroid Function Tests: No results for input(s): TSH, T4TOTAL, FREET4, T3FREE, THYROIDAB in the last 72 hours. Lipid Profile: No results for input(s): CHOL, HDL, LDLCALC, TRIG, CHOLHDL, LDLDIRECT in the last 72 hours. Anemia Panel: No results for input(s): VITAMINB12, FOLATE, FERRITIN, TIBC, IRON, RETICCTPCT in the last 72 hours. Urine analysis:    Component Value Date/Time   BILIRUBINUR neg 08/06/2018 0959   PROTEINUR Negative 08/06/2018 0959   UROBILINOGEN 0.2 08/06/2018 0959   NITRITE neg 08/06/2018 0959   LEUKOCYTESUR Negative 08/06/2018 0959   Sepsis Labs: Invalid input(s): PROCALCITONIN, [place]  Microbiology: Recent Results (from the past 240 hour(s))  SARS Coronavirus 2 by RT PCR (hospital order, performed in [facility] hospital lab) Nasopharyngeal Nasopharyngeal Swab     Status: None   Collection Time: 01/24/20  3:52 AM   Specimen: Nasopharyngeal Swab  Result Value Ref Range Status   SARS  Coronavirus 2 NEGATIVE NEGATIVE Final    Comment: (NOTE) SARS-CoV-2 target nucleic acids are NOT DETECTED.  The SARS-CoV-2 RNA is generally detectable in upper and lower respiratory specimens during the acute phase of infection. The lowest concentration of SARS-CoV-2 viral copies this assay can detect is 250 copies / mL. A negative result does not preclude SARS-CoV-2 infection and should not be used as the sole basis for treatment or other patient management decisions.  A negative result may occur with improper specimen collection / handling, submission of specimen other than nasopharyngeal swab, presence of viral mutation(s) within the areas targeted by this assay, and inadequate number of viral copies (<250 copies / mL). A negative result must be combined with clinical observations, patient history, and epidemiological information.  Fact Sheet for Patients:   StrictlyIdeas.no  Fact Sheet for Healthcare Providers: BankingDealers.co.za  This test is not yet approved or  cleared by the [place] FDA and has  been authorized for detection and/or diagnosis of SARS-CoV-2 by FDA under an Emergency Use Authorization (EUA).  This EUA will remain in effect (meaning this test can be used) for the duration of the COVID-19 declaration under Section 564(b)(1) of the Act, 21 U.S.C. section 360bbb-3(b)(1), unless the authorization is terminated or revoked sooner.  Performed at [facility], [address] [place], [place] [address]     Radiology Studies: DG Abd 1 View  Result Date: 01/25/2020 CLINICAL DATA:  Seizures.  Gastroesophageal reflux EXAM: ABDOMEN - 1 VIEW COMPARISON:  August 06, 2018 FINDINGS: There is moderate stool throughout colon. There is no bowel dilatation or air-fluid level to suggest bowel obstruction. No free air. Visualized lung bases are clear. No abnormal calcifications. IMPRESSION: Moderate stool in colon.  No bowel  obstruction or free air. Electronically Signed   By: Bretta Bang III M.D.   On: 01/25/2020 07:54   MR BRAIN WO CONTRAST  Result Date: 01/24/2020 CLINICAL DATA:  34 year old male with confusion, altered mental status, twitching. EXAM: MRI HEAD WITHOUT CONTRAST TECHNIQUE: Multiplanar, multiecho pulse sequences of the brain and surrounding structures were obtained without intravenous contrast. COMPARISON:  Head CT earlier today. FINDINGS: Brain: No restricted diffusion to suggest acute infarction. No midline shift, mass effect, evidence of mass lesion, ventriculomegaly, extra-axial collection or acute intracranial hemorrhage. Cervicomedullary junction and pituitary are within normal limits. Normal cerebral volume. Thin slice coronal images are provided. No hippocampal signal abnormality or asymmetry identified. Wallace Cullens and white matter signal is within normal limits throughout the brain. No encephalomalacia or chronic cerebral blood products. Vascular: Major intracranial vascular flow voids are preserved. Skull and upper cervical spine: Negative visible cervical spine, bone marrow signal. Sinuses/Orbits: Negative orbits. Trace paranasal sinus mucosal thickening. Other: Mastoids are clear. Visible internal auditory structures appear normal. Visible scalp and face soft tissues appear negative. IMPRESSION: Normal noncontrast MRI appearance of the brain. Electronically Signed   By: Odessa Fleming M.D.   On: 01/24/2020 19:45     Timika Muench T. Davina Howlett [facility]  If 7PM-7AM, please contact night-coverage [email or web address] Password [facility] 01/25/2020, 5:26 PM

## 2020-01-26 ENCOUNTER — Other Ambulatory Visit: Payer: Self-pay | Admitting: Adult Health

## 2020-01-26 ENCOUNTER — Observation Stay (HOSPITAL_COMMUNITY): Payer: Managed Care, Other (non HMO)

## 2020-01-26 DIAGNOSIS — F1010 Alcohol abuse, uncomplicated: Secondary | ICD-10-CM | POA: Diagnosis not present

## 2020-01-26 DIAGNOSIS — R001 Bradycardia, unspecified: Secondary | ICD-10-CM | POA: Diagnosis not present

## 2020-01-26 DIAGNOSIS — R404 Transient alteration of awareness: Secondary | ICD-10-CM | POA: Diagnosis not present

## 2020-01-26 DIAGNOSIS — Z760 Encounter for issue of repeat prescription: Secondary | ICD-10-CM

## 2020-01-26 LAB — HEMOGLOBIN A1C: Hgb A1c MFr Bld: 5 % (ref 4.6–6.5)

## 2020-01-26 LAB — TSH: TSH: 1.871 u[IU]/mL (ref 0.350–4.500)

## 2020-01-26 MED ORDER — SENNOSIDES-DOCUSATE SODIUM 8.6-50 MG PO TABS: 1.0000 | ORAL_TABLET | Freq: Two times a day (BID) | ORAL | Status: DC | PRN

## 2020-01-26 NOTE — Progress Notes (Signed)
EEG complete - results pending 

## 2020-01-26 NOTE — Procedures (Addendum)
Patient Name: Timothy Coffey  MRN: [id number]  Epilepsy Attending: Charlsie Quest  Referring Physician/Provider: Dr. Sanda Klein Date: 01/26/2020 Duration: 24.26 minutes  Patient history: 34 year old male who presented with staring spells.  EEG evaluate for seizures.  Level of alertness: Awake  AEDs during EEG study: None  Technical aspects: This EEG study was done with scalp electrodes positioned according to the 10-20 International system of electrode placement. Electrical activity was acquired at a sampling rate of 500Hz  and reviewed with a high frequency filter of 70Hz  and a low frequency filter of 1Hz . EEG data were recorded continuously and digitally stored.   Description: The posterior dominant rhythm consists of 10 Hz activity of moderate voltage (25-35 uV) seen predominantly in posterior head regions, symmetric and reactive to eye opening and eye closing.  Hyperventilation did not show any EEG change.  Physiology photic driving was seen during photic stimulation.   IMPRESSION: This study is within normal limits. No seizures or epileptiform discharges were seen throughout the recording.  Kattaleya Alia 

## 2020-01-26 NOTE — Progress Notes (Addendum)
Pt heart rate has been ranging from 39-48 when sleeping, easily aroused but outside of ordered parameters. Dr Antionette Char notified.  0133 New orders received for EKG and TSH lab with a.m. draw. Same to be done.

## 2020-01-26 NOTE — Progress Notes (Signed)
New order to discharge patient home.  PIV and cardiac monitoring discontinued.  AVS reviewed with patient and all questions answered.  AVS given to patient.  Patient eager to go home.  Wife to pick up and transport home.

## 2020-01-26 NOTE — Discharge Summary (Signed)
Physician Discharge Summary  Timothy Coffey TWS:[id number] DOB: [birth year] DOA: 01/23/2020  PCP: Shirline Frees, NP  Admit date: 01/23/2020 Discharge date: 01/26/2020  Admitted From: Home Disposition: Home  Recommendations for Outpatient Follow-up:  1. Follow ups as below. 2. Please obtain CBC/BMP/Mag at follow up 3. Please follow up on the following pending results: None  Home Health: None Equipment/Devices: None  Discharge Condition: Stable CODE STATUS: Full code   Follow-up Information    Shirline Frees, NP. Schedule an appointment as soon as possible for a visit in 1 week(s).   Specialty: Family Medicine Contact information: [address] [place] [address] [phone]               Hospital Course: 34 y.o.malewith medical history significant ofobesity, seasonal allergies, GERD who is coming to the emergency department due to developing chest pain, but this was precluded by patient being confused when he came back home from walking his dog. His significant other described that he had a blank stare for about 10 to 15 minutes and was not answering questions or acting appropriately. He is to sequentially start her responding, but then was twitching his face and arms. This lasted for several hours and then EMS call.   EMS described the patient as restless, tachypneic and tachycardic complaining of bilateral arm and facial sides numbness. Upon arrival to the ER, the patient was already feeling better and was oriented x3. He has been having pain on and off nonpleuritic, nonradiating chest pain.   At [facility] ED, work-up including vitals, CBC, D-dimer, EKG, troponin, CK, BMP, CXR and CT head without acute finding.  He was transferred to [facility] for MRI, EEG and neurology evaluation.  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  EEG obtained and negative for seizure or epileptiform discharge.  Patient has not  had further episodes during his stay.  Patient noted to have nocturnal sinus bradycardia to upper 30s and low 40s.  Has been asymptomatic.  Heart rate improved to 60s with minimal activities.  EKG without AVB or other conduction delay.  Patient was encouraged to cut down on his alcohol consumption.   See individual problem list below for more hospital course.  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  Admits to drinking 6 of the 22 ounce Bud Light beers the day prior to presentation.  Remained stable since admit.  Work-up including CT head, MRI, EEG and basic labs nonrevealing.  -Encouraged moderation or cessation.  Alcohol abuse: No withdrawal symptoms. -Encouraged moderation or cessation  Sinus bradycardia: HR dropped to upper 30s and low 40s at night when asleep. EKG without AV block.  Not on nodal blocking agents.  HR improved to 60s with minimal activity. -No further work-up warranted unless symptomatic. -Avoid nodal blocking agent.                 Discharge Exam: Vitals:   01/25/20 2126 01/26/20 0829  BP: (!) 117/91 113/67  Pulse: 62 (!) 51  Resp: 16 14  Temp: 97.9 F (36.6 C)   SpO2: 97% 99%    GENERAL: No apparent distress.  Nontoxic. HEENT: MMM.  Vision and hearing grossly intact.  NECK: Supple.  No apparent JVD.  RESP:  No IWOB.  Fair aeration bilaterally. CVS: HR in 50s but improved to 63 with walking in place. Heart sounds normal.  ABD/GI/GU: Bowel sounds present. Soft. Non tender.  MSK/EXT:  Moves extremities. No apparent  deformity. No edema.  SKIN: no apparent skin lesion or wound NEURO: Awake, alert and oriented appropriately.  No apparent focal neuro deficit. PSYCH: Calm. Normal affect.  Discharge Instructions  Discharge Instructions    Call MD for:   Complete by: As directed    More seizure-like activity or other symptoms concerning to you   Diet general   Complete by: As directed     Discharge instructions   Complete by: As directed    It has been a pleasure taking care of you!  You were hospitalized for seizure like activity. After further follow- ups and tests, it does not look like you have a seizure.  So we are discharging you to follow-up with your primary care doctor.    Meanwhile, we strongly encourage you to cut down on your alcohol consumption, not to go over more than 2 of the 12 oz beers.    We may have started you on other new medications or made some changes to your home medications during this hospitalization. Please review your new medication list and the directions carefully before you take them.    Please go to your hospital follow-up appointments or call to reschedule as recommended.   Take care,   Increase activity slowly   Complete by: As directed      Allergies as of 01/26/2020      Reactions   Shellfish Allergy    "abdominal pain"      Medication List    STOP taking these medications   aspirin EC 325 MG tablet   cyclobenzaprine 10 MG tablet Commonly known as: FLEXERIL   ketoconazole 2 % cream Commonly known as: NIZORAL     TAKE these medications   fluticasone 50 MCG/ACT nasal spray Commonly known as: FLONASE USE 2 SPRAYS INTO THE NOSE ONCE DAILY What changed: See the new instructions.   senna-docusate 8.6-50 MG tablet Commonly known as: Senokot-S Take 1 tablet by mouth 2 (two) times daily as needed for moderate constipation.       Consultations:  Neurology  Procedures/Studies:   DG Chest 2 View  Result Date: 01/23/2020 CLINICAL DATA:  Chest pain EXAM: CHEST - 2 VIEW COMPARISON:  08/29/2018 FINDINGS: The heart size and mediastinal contours are within normal limits. Both lungs are clear. The visualized skeletal structures are unremarkable. IMPRESSION: No active cardiopulmonary disease. Electronically Signed   By: Katherine Mantle M.D.   On: 01/23/2020 22:42   DG Abd 1 View  Result Date: 01/25/2020 CLINICAL DATA:   Seizures.  Gastroesophageal reflux EXAM: ABDOMEN - 1 VIEW COMPARISON:  August 06, 2018 FINDINGS: There is moderate stool throughout colon. There is no bowel dilatation or air-fluid level to suggest bowel obstruction. No free air. Visualized lung bases are clear. No abnormal calcifications. IMPRESSION: Moderate stool in colon.  No bowel obstruction or free air. Electronically Signed   By: Bretta Bang III M.D.   On: 01/25/2020 07:54   CT Head Wo Contrast  Result Date: 01/24/2020 CLINICAL DATA:  Altered mental status EXAM: CT HEAD WITHOUT CONTRAST TECHNIQUE: Contiguous axial images were obtained from the base of the skull through the vertex without intravenous contrast. COMPARISON:  None. FINDINGS: Brain: No acute intracranial abnormality. Specifically, no hemorrhage, hydrocephalus, mass lesion, acute infarction, or significant intracranial injury. Vascular: No hyperdense vessel or unexpected calcification. Skull: No acute calvarial abnormality. Sinuses/Orbits: Visualized paranasal sinuses and mastoids clear. Orbital soft tissues unremarkable. Other: None IMPRESSION: Normal study. Electronically Signed   By: Charlett Nose M.D.  On: 01/24/2020 03:21   MR BRAIN WO CONTRAST  Result Date: 01/24/2020 CLINICAL DATA:  34 year old male with confusion, altered mental status, twitching. EXAM: MRI HEAD WITHOUT CONTRAST TECHNIQUE: Multiplanar, multiecho pulse sequences of the brain and surrounding structures were obtained without intravenous contrast. COMPARISON:  Head CT earlier today. FINDINGS: Brain: No restricted diffusion to suggest acute infarction. No midline shift, mass effect, evidence of mass lesion, ventriculomegaly, extra-axial collection or acute intracranial hemorrhage. Cervicomedullary junction and pituitary are within normal limits. Normal cerebral volume. Thin slice coronal images are provided. No hippocampal signal abnormality or asymmetry identified. Pearline Cables and white matter signal is within normal  limits throughout the brain. No encephalomalacia or chronic cerebral blood products. Vascular: Major intracranial vascular flow voids are preserved. Skull and upper cervical spine: Negative visible cervical spine, bone marrow signal. Sinuses/Orbits: Negative orbits. Trace paranasal sinus mucosal thickening. Other: Mastoids are clear. Visible internal auditory structures appear normal. Visible scalp and face soft tissues appear negative. IMPRESSION: Normal noncontrast MRI appearance of the brain. Electronically Signed   By: Genevie Ann M.D.   On: 01/24/2020 19:45        The results of significant diagnostics from this hospitalization (including imaging, microbiology, ancillary and laboratory) are listed below for reference.     Microbiology: Recent Results (from the past 240 hour(s))  SARS Coronavirus 2 by RT PCR (hospital order, performed in [facility] hospital lab) Nasopharyngeal Nasopharyngeal Swab     Status: None   Collection Time: 01/24/20  3:52 AM   Specimen: Nasopharyngeal Swab  Result Value Ref Range Status   SARS Coronavirus 2 NEGATIVE NEGATIVE Final    Comment: (NOTE) SARS-CoV-2 target nucleic acids are NOT DETECTED.  The SARS-CoV-2 RNA is generally detectable in upper and lower respiratory specimens during the acute phase of infection. The lowest concentration of SARS-CoV-2 viral copies this assay can detect is 250 copies / mL. A negative result does not preclude SARS-CoV-2 infection and should not be used as the sole basis for treatment or other patient management decisions.  A negative result may occur with improper specimen collection / handling, submission of specimen other than nasopharyngeal swab, presence of viral mutation(s) within the areas targeted by this assay, and inadequate number of viral copies (<250 copies / mL). A negative result must be combined with clinical observations, patient history, and epidemiological information.  Fact Sheet for Patients:    StrictlyIdeas.no  Fact Sheet for Healthcare Providers: BankingDealers.co.za  This test is not yet approved or  cleared by the [place] FDA and has been authorized for detection and/or diagnosis of SARS-CoV-2 by FDA under an Emergency Use Authorization (EUA).  This EUA will remain in effect (meaning this test can be used) for the duration of the COVID-19 declaration under Section 564(b)(1) of the Act, 21 U.S.C. section 360bbb-3(b)(1), unless the authorization is terminated or revoked sooner.  Performed at [facility], [address] [place], [place] [address]      Labs: BNP (last 3 results) No results for input(s): BNP in the last 8760 hours. Basic Metabolic Panel: Recent Labs  Lab 01/23/20 2235 01/25/20 0429  NA 136 137  K 3.5 3.8  CL 102 105  CO2 24 22  GLUCOSE 103* 109*  BUN 18 11  CREATININE 1.06 0.90  CALCIUM 9.4 9.1   Liver Function Tests: No results for input(s): AST, ALT, ALKPHOS, BILITOT, PROT, ALBUMIN in the last 168 hours. No results for input(s): LIPASE, AMYLASE in the last 168 hours. No results for input(s): AMMONIA  in the last 168 hours. CBC: Recent Labs  Lab 01/23/20 2318  WBC 9.0  NEUTROABS 6.7  HGB 14.1  HCT 40.7  MCV 89.5  PLT 221   Cardiac Enzymes: Recent Labs  Lab 01/24/20 0156  CKTOTAL 80   BNP: Invalid input(s): POCBNP CBG: No results for input(s): GLUCAP in the last 168 hours. D-Dimer Recent Labs    01/24/20 0151  DDIMER <0.27   Hgb A1c No results for input(s): HGBA1C in the last 72 hours. Lipid Profile No results for input(s): CHOL, HDL, LDLCALC, TRIG, CHOLHDL, LDLDIRECT in the last 72 hours. Thyroid function studies Recent Labs    01/26/20 0226  TSH 1.871   Anemia work up No results for input(s): VITAMINB12, FOLATE, FERRITIN, TIBC, IRON, RETICCTPCT in the last 72 hours. Urinalysis    Component Value Date/Time   BILIRUBINUR neg 08/06/2018 0959   PROTEINUR  Negative 08/06/2018 0959   UROBILINOGEN 0.2 08/06/2018 0959   NITRITE neg 08/06/2018 0959   LEUKOCYTESUR Negative 08/06/2018 0959   Sepsis Labs Invalid input(s): PROCALCITONIN,  WBC,  LACTICIDVEN   Time coordinating discharge: 25 minutes  SIGNED:  Almon Hercules, MD  [facility] 01/26/2020, 11:39 AM  If 7PM-7AM, please contact night-coverage [email or web address] Password [email or web address]

## 2020-01-26 NOTE — Progress Notes (Signed)
NEUROLOGY PROGRESS NOTE  Subjective: Currently hooked up to EEG at this point in time.  Patient is at baseline.  States that he was not overexerted that day, rarely drinks, is under no stress and had no other further episodes overnight.  Exam: Vitals:   01/25/20 2126 01/26/20 0829  BP: (!) 117/91 113/67  Pulse: 62 (!) 51  Resp: 16 14  Temp: 97.9 F (36.6 C)   SpO2: 97% 99%    Neuro:  Mental Status: Alert, oriented, thought content appropriate.  Speech fluent without evidence of aphasia.  Able to follow 3 step commands without difficulty. Cranial Nerves: II:  Visual fields grossly normal,  III,IV, VI: ptosis not present, extra-ocular motions intact bilaterally pupils equal, round, reactive to light and accommodation V,VII: smile symmetric, facial light touch sensation normal bilaterally VIII: hearing normal bilaterally Motor: Moving all extremities antigravity Sensory: Pinprick and light touch intact throughout, bilaterally Plantars: Right: downgoing   Left: downgoing    Medications:  Scheduled: . enoxaparin (LOVENOX) injection  40 mg Subcutaneous Q24H   Continuous:   Pertinent Labs/Diagnostics:   MR BRAIN WO CONTRAST  Result Date: 01/24/2020 CLINICAL DATA:  34 year old male with confusion, altered mental status, twitching. EXAM: MRI HEAD WITHOUT CONTRAST TECHNIQUE: Multiplanar, multiecho pulse sequences of the brain and surrounding structures were obtained without intravenous contrast. COMPARISON:  Head CT earlier today. FINDINGS: Brain: No restricted diffusion to suggest acute infarction. No midline shift, mass effect, evidence of mass lesion, ventriculomegaly, extra-axial collection or acute intracranial hemorrhage. Cervicomedullary junction and pituitary are within normal limits. Normal cerebral volume. Thin slice coronal images are provided. No hippocampal signal abnormality or asymmetry identified. Wallace Cullens and white matter signal is within normal limits throughout the  brain. No encephalomalacia or chronic cerebral blood products. Vascular: Major intracranial vascular flow voids are preserved. Skull and upper cervical spine: Negative visible cervical spine, bone marrow signal. Sinuses/Orbits: Negative orbits. Trace paranasal sinus mucosal thickening. Other: Mastoids are clear. Visible internal auditory structures appear normal. Visible scalp and face soft tissues appear negative. IMPRESSION: Normal noncontrast MRI appearance of the brain. Electronically Signed   By: Odessa Fleming M.D.   On: 01/24/2020 19:45     Felicie Morn PA-C [facility] [phone]  Assessment: 34 year old male with history of obesity and GERD presented to [facility] with chest pain and staring spells.  Patient was transferred to [facility].  MRI brain was negative.  Currently receiving EEG.  No further episodes overnight.  Neurologic exam is normal.  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

## 2020-01-27 ENCOUNTER — Telehealth: Payer: Self-pay | Admitting: *Deleted

## 2020-01-27 NOTE — Telephone Encounter (Signed)
Sent to the pharmacy by e-scribe. 

## 2020-01-27 NOTE — Telephone Encounter (Signed)
Transition Care Management Follow-up Telephone Call   Date discharged? 01/26/2020   How have you been since you were released from the hospital? " I've been doing pretty good"     Do you understand why you were in the hospital? yes   Do you understand the discharge instructions? yes   Where were you discharged to? Home    Items Reviewed:  Medications reviewed: yes  Allergies reviewed: yes  Dietary changes reviewed: N/A   Referrals reviewed:  N/A    Functional Questionnaire:   Activities of Daily Living (ADLs):   He states they are independent in the following: ambulation, bathing and hygiene, feeding, continence, grooming, toileting and dressing States they require assistance with the following: N/A    Any transportation issues/concerns?: no   Any patient concerns? yes, Questions in regards to continue or stop muscle relaxer. Patient was informed to stop taking muscle relaxer by hospital.    Confirmed importance and date/time of follow-up visits scheduled yes  Provider Appointment booked with Shirline Frees, NP 01/30/2020 at 11:30AM  Confirmed with patient if condition begins to worsen call PCP or go to the ER.  Patient was given the office number and encouraged to call back with question or concerns.  : yes

## 2020-01-29 ENCOUNTER — Other Ambulatory Visit: Payer: Self-pay

## 2020-01-30 ENCOUNTER — Encounter: Payer: Self-pay | Admitting: Adult Health

## 2020-01-30 ENCOUNTER — Ambulatory Visit: Payer: Managed Care, Other (non HMO) | Admitting: Adult Health

## 2020-01-30 VITALS — BP 130/90 | Temp 98.4°F | Wt 215.0 lb

## 2020-01-30 DIAGNOSIS — R569 Unspecified convulsions: Secondary | ICD-10-CM

## 2020-01-30 DIAGNOSIS — R001 Bradycardia, unspecified: Secondary | ICD-10-CM | POA: Diagnosis not present

## 2020-01-30 DIAGNOSIS — G4730 Sleep apnea, unspecified: Secondary | ICD-10-CM

## 2020-01-30 NOTE — Progress Notes (Signed)
Subjective:    Patient ID: Timothy Coffey, male    DOB: 12-Apr-1986, 33 y.o.   MRN: [id number]  HPI 34 year old male who  has a past medical history of Allergy, GERD (gastroesophageal reflux disease), and Obesity.  He presents to the office today for TCM visit   Admit Date 01/23/2020 Discharge Date 01/26/2020  He presented to the emergency room due to developing chest pain that was preoccluded by patient being confused when he came back home from walking his dog.  His significant other describes that he had a blank stare for about 10 to 15 minutes and was not answering questions or acting appropriately.  He did develop twitching of his face and arms that lasted for several hours and then EMS was called.  EMS described the patient as restless, tachycardia P neck, and tachycardic complaining of bilateral arm and facial side numbness.  Upon arrival to the ER the patient was already feeling better and was oriented x3.  He has been having pain on and off nonpleuritic, nonradiating chest pain.  In the ED his work-up including vitals, CBC, D-dimer, EKG, troponin, CK, BMP, chest x-ray, and CT head without any acute findings.  He was then transferred to [facility] for MRI, EEG, and neurology evaluation.  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  EEG obtained and negative for seizure or epileptiform discharge.  Patient had no other further episodes during his stay.  He was noted to have nocturnal sinus bradycardia to upper 30s and low 40s.  He was asymptomatic.  Heart rate improved to 60s with normal activities.  EKG without AVB or other conduction delay.  No further work-up was warranted unless he became symptomatic  He was encouraged to cut down on his alcohol consumption as he admitted to drinking 6- 22 ounce Bud Light beers a day prior to admission.  He presents to the office today for follow up.  Today he reports that he feels "much better".  He does not  remember most of the incident while at home but does remember the hospital stay.  He denies drinking any alcohol date of admission stating "I know what alcohol does to me and I know that I did not drink any that day".  He is concerned about his low heart rate when he is sleeping.  He does report that he will have apneic episodes when sleeping but does not snore.  He does wake up feeling fatigued.    Review of Systems  Constitutional: Negative.   HENT: Negative.   Eyes: Negative.   Respiratory: Negative.   Cardiovascular: Negative.   Gastrointestinal: Negative.   Endocrine: Negative.   Genitourinary: Negative.   Musculoskeletal: Negative.   Skin: Negative.   Allergic/Immunologic: Negative.   Neurological: Negative.   Hematological: Negative.   Psychiatric/Behavioral: Negative.   All other systems reviewed and are negative.  Past Medical History:  Diagnosis Date  . Allergy   . GERD (gastroesophageal reflux disease)   . Obesity     Social History   Socioeconomic History  . Marital status: Married    Spouse name: Not on file  . Number of children: Not on file  . Years of education: Not on file  . Highest education level: Not on file  Occupational History  . Occupation: Personnel officer  Tobacco Use  . Smoking status: Former Games developer  . Smokeless tobacco: Never Used  Vaping Use  . Vaping Use: Never used  Substance and Sexual  Activity  . Alcohol use: Yes    Alcohol/week: 0.0 standard drinks    Comment: 14 beers a day   . Drug use: No  . Sexual activity: Not on file  Other Topics Concern  . Not on file  Social History Narrative   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[employer]   Married    Two children       He likes to be outside    Social Determinants of Corporate investment banker Strain:   . Difficulty of Paying Living Expenses:   Food Insecurity:   . Worried About Programme researcher, broadcasting/film/video in the Last Year:   . Barista in the Last Year:   Transportation Needs:   .  Freight forwarder (Medical):   Marland Kitchen Lack of Transportation (Non-Medical):   Physical Activity:   . Days of Exercise per Week:   . Minutes of Exercise per Session:   Stress:   . Feeling of Stress :   Social Connections:   . Frequency of Communication with Friends and Family:   . Frequency of Social Gatherings with Friends and Family:   . Attends Religious Services:   . Active Member of Clubs or Organizations:   . Attends Banker Meetings:   Marland Kitchen Marital Status:   Intimate Partner Violence:   . Fear of Current or Ex-Partner:   . Emotionally Abused:   Marland Kitchen Physically Abused:   . Sexually Abused:     No past surgical history on file.  Family History  Problem Relation Age of Onset  . Arthritis Father   . Heart disease Father   . Skin cancer Father   . Hypertension Other        Family history   . Hyperlipidemia Other        Family History  . Thyroid disease Mother     Allergies  Allergen Reactions  . Shellfish Allergy     "abdominal pain"    Current Outpatient Medications on File Prior to Visit  Medication Sig Dispense Refill  . fluticasone (FLONASE) 50 MCG/ACT nasal spray USE 2 SPRAYS INTO THE NOSE ONCE DAILY 48 mL 3  . senna-docusate (SENOKOT-S) 8.6-50 MG tablet Take 1 tablet by mouth 2 (two) times daily as needed for moderate constipation.     No current facility-administered medications on file prior to visit.    There were no vitals taken for this visit.      Objective:   Physical Exam Vitals and nursing note reviewed.  Constitutional:      General: He is not in acute distress.    Appearance: Normal appearance. He is well-developed and normal weight.  HENT:     Head: Normocephalic and atraumatic.     Right Ear: Tympanic membrane, ear canal and external ear normal. There is no impacted cerumen.     Left Ear: Tympanic membrane, ear canal and external ear normal. There is no impacted cerumen.     Nose: Nose normal. No congestion or rhinorrhea.      Mouth/Throat:     Mouth: Mucous membranes are moist.     Pharynx: Oropharynx is clear. No oropharyngeal exudate or posterior oropharyngeal erythema.  Eyes:     General:        Right eye: No discharge.        Left eye: No discharge.     Extraocular Movements: Extraocular movements intact.     Conjunctiva/sclera: Conjunctivae normal.     Pupils: Pupils  are equal, round, and reactive to light.  Neck:     Vascular: No carotid bruit.     Trachea: No tracheal deviation.  Cardiovascular:     Rate and Rhythm: Normal rate and regular rhythm.     Pulses: Normal pulses.     Heart sounds: Normal heart sounds. No murmur heard.  No friction rub. No gallop.   Pulmonary:     Effort: Pulmonary effort is normal. No respiratory distress.     Breath sounds: Normal breath sounds. No stridor. No wheezing, rhonchi or rales.  Chest:     Chest wall: No tenderness.  Abdominal:     General: Bowel sounds are normal. There is no distension.     Palpations: Abdomen is soft. There is no mass.     Tenderness: There is no abdominal tenderness. There is no right CVA tenderness, left CVA tenderness, guarding or rebound.     Hernia: No hernia is present.  Musculoskeletal:        General: No swelling, tenderness, deformity or signs of injury. Normal range of motion.     Right lower leg: No edema.     Left lower leg: No edema.  Lymphadenopathy:     Cervical: No cervical adenopathy.  Skin:    General: Skin is warm and dry.     Capillary Refill: Capillary refill takes less than 2 seconds.     Coloration: Skin is not jaundiced or pale.     Findings: No bruising, erythema, lesion or rash.  Neurological:     General: No focal deficit present.     Mental Status: He is alert and oriented to person, place, and time.     Cranial Nerves: No cranial nerve deficit.     Sensory: No sensory deficit.     Motor: No weakness.     Coordination: Coordination normal.     Gait: Gait normal.     Deep Tendon Reflexes: Reflexes  normal.  Psychiatric:        Mood and Affect: Mood normal.        Behavior: Behavior normal.        Thought Content: Thought content normal.        Judgment: Judgment normal.       Assessment & Plan:  1. Seizure-like activity ([place]) -Reviewed hospital admission and discharge paperwork, labs, and imaging.  All questions answered to the best of my ability -He does not feel as though he is anxious or needs to go on an SSRI, I agree with this we will hold off on any medication treatment at this time.  We will check an ultrasound of the carotids. - US Carotid Duplex Bilateral; Future  2. Sleep apnea, unspecified type  - Ambulatory referral to Pulmonology  3. Bradycardia -Do not feel as though we need to refer to cardiology at this time.  I am wondering if bradycardia is due to sleep apnea  Dorothyann Peng, NP

## 2020-03-30 ENCOUNTER — Other Ambulatory Visit: Payer: Self-pay

## 2020-03-30 ENCOUNTER — Encounter: Payer: Self-pay | Admitting: Pulmonary Disease

## 2020-03-30 ENCOUNTER — Ambulatory Visit (INDEPENDENT_AMBULATORY_CARE_PROVIDER_SITE_OTHER): Payer: Managed Care, Other (non HMO) | Admitting: Pulmonary Disease

## 2020-03-30 VITALS — BP 148/84 | HR 73 | Temp 97.1°F | Ht 71.0 in | Wt 230.6 lb

## 2020-03-30 DIAGNOSIS — R0683 Snoring: Secondary | ICD-10-CM | POA: Diagnosis not present

## 2020-03-30 NOTE — Patient Instructions (Signed)
Will arrange for home sleep study Will call to arrange for follow up after sleep study reviewed  

## 2020-03-30 NOTE — Progress Notes (Addendum)
Pulmonary, Critical Care, and Sleep Medicine  Chief Complaint  Patient presents with  . Consult    sleep apnea    Constitutional:  BP (!) 148/84 (BP Location: Left Arm, Cuff Size: Normal)   Pulse 73   Temp (!) 97.1 F (36.2 C) (Other (Comment)) Comment (Src): Wrist  Ht 5\' 11"  (1.803 m)   Wt 230 lb 9.6 oz (104.6 kg)   SpO2 98% Comment: Room air  BMI 32.16 kg/m   Past Medical History:  Allergies, GERD  Summary:  Timothy Coffey is a 34 y.o. male former smoker with daytime sleepiness.  Subjective:   He was in hospital in June for spell of altered mental status.  Had w/u for seizures - MRI brain and EEG negative.  Didn't need AEDs.  Had f/u with PCP.  His wife concerned about his breathing at night and his heart rate gets low then fast.  He snores some and is always tired.  Gets dry mouth at night.  Can't sleep on his back.    He goes to sleep at 10 pm.  He falls asleep in about 30 minutes.  He sleeps through the night.  He gets out of bed at 430 am work days.  He sleeps in when not working, but doesn't seem to help.  He feels tired in the morning.  He denies morning headache.  He does not use anything to help him fall sleep.  He drinks coffee in the morning.  He denies sleep walking, sleep talking, bruxism, or nightmares.  There is no history of restless legs.  He denies sleep hallucinations, sleep paralysis, or cataplexy.  The Epworth score is 4 out of 24.  Physical Exam:   Appearance - well kempt  ENMT - no sinus tenderness, no nasal discharge, no oral exudate, Mallampati 4, enlarged tongue, high arched palate  Respiratory - no wheeze, or rales  CV - regular rate and rhythm, no murmurs  GI - soft, non tender  Lymph - no adenopathy noted in neck  Ext - no edema  Skin - no rashes  Neuro - normal strength, oriented x 3  Psych - normal mood and affect  Discussion:  He has snoring, sleep disruption, apnea, and daytime sleepiness.  His blood pressure is  elevated today.  I am concerned he could have obstructive sleep apnea.  Assessment/Plan:   Snoring with excessive daytime sleepiness. - will need to arrange for a home sleep study  Obesity. - discussed how weight can impact sleep and risk for sleep disordered breathing - discussed options to assist with weight loss: combination of diet modification, cardiovascular and strength training exercises  Cardiovascular risk. - had an extensive discussion regarding the adverse health consequences related to untreated sleep disordered breathing - specifically discussed the risks for hypertension, coronary artery disease, cardiac dysrhythmias, cerebrovascular disease, and diabetes - lifestyle modification discussed  Safe driving practices. - discussed how sleep disruption can increase risk of accidents, particularly when driving - safe driving practices were discussed  Therapies for obstructive sleep apnea. - if the sleep study shows significant sleep apnea, then various therapies for treatment were reviewed: CPAP, oral appliance, and surgical interventions  A total of  32 minutes spent addressing patient care issues on day of visit.  Follow up:  Patient Instructions  Will arrange for home sleep study Will call to arrange for follow up after sleep study reviewed    Signature:  July, MD [facility] Pulmonary/Critical Care Pager: [phone] 03/30/2020, 10:44 AM  Flow Sheet    Sleep tests:    Medications:   Allergies as of 03/30/2020      Reactions   Shellfish Allergy    "abdominal pain"      Medication List       Accurate as of March 30, 2020 10:44 AM. If you have any questions, ask your nurse or doctor.        fluticasone 50 MCG/ACT nasal spray Commonly known as: FLONASE USE 2 SPRAYS INTO THE NOSE ONCE DAILY   senna-docusate 8.6-50 MG tablet Commonly known as: Senokot-S Take 1 tablet by mouth 2 (two) times daily as needed for moderate constipation.        Past Surgical History:  He denies prior surgeries.  Family History:  His family history includes Arthritis in his father; Heart disease in his father; Hyperlipidemia in an other family member; Hypertension in an other family member; Skin cancer in his father; Thyroid disease in his mother.  Social History:  He  reports that he quit smoking about 13 years ago. His smoking use included cigarettes. He has a 1.50 pack-year smoking history. He has never used smokeless tobacco. He reports current alcohol use. He reports that he does not use drugs.

## 2020-06-28 ENCOUNTER — Telehealth: Payer: Self-pay | Admitting: Pulmonary Disease

## 2020-07-02 NOTE — Telephone Encounter (Signed)
Pt scheduled for HST on 12/27.  Nothing further needed at this time- will close encounter.

## 2020-07-28 ENCOUNTER — Ambulatory Visit: Payer: Managed Care, Other (non HMO) | Admitting: Pulmonary Disease

## 2020-08-09 ENCOUNTER — Ambulatory Visit: Payer: Managed Care, Other (non HMO)

## 2020-08-09 ENCOUNTER — Other Ambulatory Visit: Payer: Self-pay

## 2020-08-09 DIAGNOSIS — G4733 Obstructive sleep apnea (adult) (pediatric): Secondary | ICD-10-CM | POA: Diagnosis not present

## 2020-08-09 DIAGNOSIS — R0683 Snoring: Secondary | ICD-10-CM

## 2020-08-25 ENCOUNTER — Ambulatory Visit: Payer: Managed Care, Other (non HMO) | Admitting: Adult Health

## 2020-08-27 ENCOUNTER — Ambulatory Visit: Payer: Managed Care, Other (non HMO) | Admitting: Adult Health

## 2020-08-27 ENCOUNTER — Encounter: Payer: Self-pay | Admitting: Adult Health

## 2020-08-27 ENCOUNTER — Other Ambulatory Visit: Payer: Self-pay

## 2020-08-27 VITALS — BP 118/80 | Temp 98.3°F | Wt 228.0 lb

## 2020-08-27 DIAGNOSIS — R590 Localized enlarged lymph nodes: Secondary | ICD-10-CM

## 2020-08-27 DIAGNOSIS — M549 Dorsalgia, unspecified: Secondary | ICD-10-CM

## 2020-08-27 NOTE — Progress Notes (Signed)
Subjective:    Patient ID: Timothy Coffey, male    DOB: 08/16/1985, 35 y.o.   MRN: [id number]  HPI 35 year old male who  has a past medical history of Allergy, Bradycardia, GERD (gastroesophageal reflux disease), and Obesity.   Presents to the office today for 2 acute issues.  First issue being swelling in bilateral armpits.  Reports that he first noticed after receiving the COVID vaccination in November 2021.  And this time he had some tenderness but this is since resolved, he feels as though he continues to have swelling.  Additionally he reports that over the last week he has been experiencing pain in his upper back.  Pain feels as a "ache, almost like I have a bruise".  Denies trauma but does do a lot of lifting at work.  Has no associated shortness of breath, chest pain, cough, or abdominal pain    Review of Systems See HPI   Past Medical History:  Diagnosis Date  . Allergy   . Bradycardia   . GERD (gastroesophageal reflux disease)   . Obesity     Social History   Socioeconomic History  . Marital status: Married    Spouse name: Not on file  . Number of children: Not on file  . Years of education: Not on file  . Highest education level: Not on file  Occupational History  . Occupation: Personnel officer  Tobacco Use  . Smoking status: Former Smoker    Packs/day: 0.50    Years: 3.00    Pack years: 1.50    Types: Cigarettes    Quit date: 2008    Years since quitting: 14.0  . Smokeless tobacco: Never Used  Vaping Use  . Vaping Use: Never used  Substance and Sexual Activity  . Alcohol use: Yes    Alcohol/week: 0.0 standard drinks    Comment: 2-6 beers per day  . Drug use: No  . Sexual activity: Not on file  Other Topics Concern  . 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 No pertinent surgical history.  Family History  Problem Relation Age of Onset  . Arthritis Father   . Heart disease Father   . Skin cancer Father   . Hypertension Other        Family history   . Hyperlipidemia Other        Family History  . Thyroid disease Mother     Allergies  Allergen Reactions  . Shellfish Allergy     "abdominal pain"    Current Outpatient Medications on File Prior to Visit  Medication Sig Dispense Refill  . fluticasone (FLONASE) 50 MCG/ACT nasal spray USE 2 SPRAYS INTO THE NOSE ONCE DAILY 48 mL 3  . senna-docusate (SENOKOT-S) 8.6-50 MG tablet Take 1 tablet by mouth 2 (two) times daily as needed for moderate constipation.     No current facility-administered medications on file prior to visit.    BP 118/80   Temp 98.3 F (36.8 C)   Wt 228  lb (103.4 kg)   BMI 31.80 kg/m       Objective:   Physical Exam Vitals and nursing note reviewed.  Constitutional:      Appearance: Normal appearance.  Cardiovascular:     Rate and Rhythm: Normal rate and regular rhythm.     Pulses: Normal pulses.     Heart sounds: Normal heart sounds.  Pulmonary:     Effort: Pulmonary effort is normal.     Breath sounds: Normal breath sounds.  Chest:  Breasts:     Right: No axillary adenopathy.     Left: No axillary adenopathy.    Musculoskeletal:        General: Tenderness (Mild tenderness with palpation along paraspinal muscles in her back.  No spinal tenderness or step-offs noted) present. Normal range of motion.  Lymphadenopathy:     Upper Body:     Right upper body: No axillary adenopathy.     Left upper body: No axillary adenopathy.     Comments: No swelling or signs of Lymphadenitis noted.   Skin:    General: Skin is warm and dry.     Capillary Refill: Capillary  refill takes less than 2 seconds.  Neurological:     General: No focal deficit present.     Mental Status: He is alert.  Psychiatric:        Mood and Affect: Mood normal.        Behavior: Behavior normal.        Thought Content: Thought content normal.        Judgment: Judgment normal.       Assessment & Plan:  1. Upper back pain -Appears to be more scaler.  Advised conservative measures.  Work on posture.  Follow-up if no improvement  2. Axillary adenopathy -Benign.  I believe what he is seen as "swelling" is likely due to body habitus.  Weight loss should help  Shirline Frees, NP

## 2020-09-15 ENCOUNTER — Telehealth: Payer: Self-pay | Admitting: Pulmonary Disease

## 2020-09-15 DIAGNOSIS — G4733 Obstructive sleep apnea (adult) (pediatric): Secondary | ICD-10-CM | POA: Diagnosis not present

## 2020-09-15 NOTE — Telephone Encounter (Signed)
HST 08/09/20 >> AHI 13, SpO2 low 85%  Please inform him that his sleep study shows mild obstructive sleep apnea.  Please arrange for ROV with me or NP to discuss treatment options.

## 2020-09-16 NOTE — Telephone Encounter (Signed)
Attempted to call patient to go over HST results per Dr Craige Cotta. No answer and unable to leave message on voicemail due to box being full. Will attempt to call again at another time.

## 2020-09-20 NOTE — Telephone Encounter (Signed)
Attempted to call patient to go over HST results per Dr Craige Cotta. No answer and writer unable to leave message on voicemail due to mailbox being full. Letter printed to please call us to go over results and sent due to multiple attempts to reach patient by phone.

## 2020-09-24 NOTE — Telephone Encounter (Signed)
Pt states got a letter so called back however  He is getting ready to get on a plane and won't be back until 09/29/20. He requests we call him on 09/30/20 with the results. Please advise [phone]

## 2020-09-24 NOTE — Telephone Encounter (Signed)
HST 08/09/20 >> AHI 13, SpO2 low 85% Please inform him that his sleep study shows mild obstructive sleep apnea.  Please arrange for ROV with me or NP to discuss treatment options. Will call back on 2/17 with results as requested

## 2020-09-28 NOTE — Telephone Encounter (Signed)
Patient returned phone call and writer went over HST results per Dr Craige Cotta. All questions answered and patient expressed full understanding. Patient stated he is currently in [place] and has scheduled a televisit with NP for Monday, 10/04/2020 at 9am. Patient provided best phone number for visit and is agreeable to time and date. Nothing further needed at this time.

## 2020-10-04 ENCOUNTER — Encounter: Payer: Self-pay | Admitting: Primary Care

## 2020-10-04 ENCOUNTER — Ambulatory Visit (INDEPENDENT_AMBULATORY_CARE_PROVIDER_SITE_OTHER): Payer: Managed Care, Other (non HMO) | Admitting: Primary Care

## 2020-10-04 ENCOUNTER — Other Ambulatory Visit: Payer: Self-pay

## 2020-10-04 DIAGNOSIS — G4733 Obstructive sleep apnea (adult) (pediatric): Secondary | ICD-10-CM

## 2020-10-04 NOTE — Progress Notes (Signed)
Reviewed and agree with assessment/plan.   Coralyn Helling, MD [facility] Pulmonary/Critical Care 10/04/2020, 10:29 AM Pager:  [phone]

## 2020-10-04 NOTE — Patient Instructions (Addendum)
Home sleep study showed mild obstructive sleep apnea  You have 13 events an hour on average   Treatment options including weight loss, side sleeping position, oral appliance CPAP or referral to ENT  We have placed an order for CPAP start auto titrate 5-15cm h20 (there is a back log on machines, may take several weeks)  Recommend you wear CPAP every night 4-6 hours or longer  Do not drive if experiencing excessive daytime fatigue or somnolence  Follow-up 3 months

## 2020-10-04 NOTE — Progress Notes (Signed)
  Virtual Visit via Telephone Note  I connected with Timothy Coffey on 10/04/20 at  9:00 AM EST by telephone and verified that I am speaking with the correct person using two identifiers.  Location: Patient: Home Provider: Office   I discussed the limitations, risks, security and privacy concerns of performing an evaluation and management service by telephone and the availability of in person appointments. I also discussed with the patient that there may be a patient responsible charge related to this service. The patient expressed understanding and agreed to proceed.   History of Present Illness: 35 year old male, former smoker. PMH significant for GERD, seizure disorder, snoring. Patient of Dr. Craige Cotta, seen for initial consult on 03/30/20.  10/04/2020 Patient contacted today to review home sleep study. HST on 08/09/20 showed mild OSA, AHI 13 with SpO2 85%. Reports symptoms of snoring, daytime somnolence and dry mouth. We reviewed treatment options including weight loss, side sleeping position, oral appliance CPAP or referral to ENT. He would like to proceed with CPAP therapy d/t severity of his symptoms. He has been unsuccessful with weight loss in the past.    Observations/Objective: - Able to speak in full sentences; no overt shortness of breath   HST 08/09/20 >> AHI 13, SpO2 low 85%  Assessment and Plan:  Mild OSA: - HST 08/09/20 showed mild OSA >> AHI 13  - We reviewed treatment options including weight loss, side sleeping position, oral appliance CPAP or referral to ENT. He would like to proceed with CPAP therapy d/t severity of his symptoms and inability to lose weight  - DME order placed for new CPAP start auto titrate 5-15cm h20, mask of choice  - Recommend patient to aim to wear CPAP every night for minimum 4-6 hours or longer; advised he not drive if experiencing excessive daytime fatigue/somolence   Follow Up Instructions:   - 3-4 months for compliance check (there is  currently a back log of CPAP machines d/t recall)  I discussed the assessment and treatment plan with the patient. The patient was provided an opportunity to ask questions and all were answered. The patient agreed with the plan and demonstrated an understanding of the instructions.   The patient was advised to call back or seek an in-person evaluation if the symptoms worsen or if the condition fails to improve as anticipated.  I provided 18 minutes of non-face-to-face time during this encounter.   Glenford Bayley, NP

## 2020-11-17 ENCOUNTER — Encounter: Payer: Self-pay | Admitting: Adult Health

## 2021-02-11 ENCOUNTER — Ambulatory Visit: Payer: Managed Care, Other (non HMO) | Admitting: Adult Health

## 2021-02-22 ENCOUNTER — Ambulatory Visit: Payer: Managed Care, Other (non HMO) | Admitting: Adult Health

## 2021-04-22 ENCOUNTER — Encounter: Payer: Self-pay | Admitting: Adult Health

## 2021-05-03 ENCOUNTER — Encounter: Payer: Self-pay | Admitting: Primary Care

## 2021-05-03 ENCOUNTER — Telehealth: Payer: BC Managed Care – PPO | Admitting: Primary Care

## 2021-05-03 VITALS — Ht 71.0 in | Wt 180.0 lb

## 2021-05-03 DIAGNOSIS — G4733 Obstructive sleep apnea (adult) (pediatric): Secondary | ICD-10-CM

## 2021-05-03 DIAGNOSIS — Z9989 Dependence on other enabling machines and devices: Secondary | ICD-10-CM

## 2021-05-03 NOTE — Progress Notes (Signed)
Virtual Visit via Video Note  I connected with Timothy Coffey on 05/03/21 at 10:00 AM EDT by a video enabled telemedicine application and verified that I am speaking with the correct person using two identifiers.  Location: Patient: Home Provider: Office    I discussed the limitations of evaluation and management by telemedicine and the availability of in person appointments. The patient expressed understanding and agreed to proceed.  History of Present Illness: 35 year old male, former smoker. PMH significant for GERD, seizure disorder, snoring. Patient of Dr. Craige Cotta, seen for initial consult on 03/30/20.  Previous [facility] pulmonary encounter:  10/04/2020 Patient contacted today to review home sleep study. HST on 08/09/20 showed mild OSA, AHI 13 with SpO2 85%. Reports symptoms of snoring, daytime somnolence and dry mouth. We reviewed treatment options including weight loss, side sleeping position, oral appliance CPAP or referral to ENT. He would like to proceed with CPAP therapy d/t severity of his symptoms. He has been unsuccessful with weight loss in the past.    05/03/2021- Interim hx  Patient contacted today for 6 month follow-up for mild OSA. He is doing well, no acute complaints. He received CPAP machine in July. When he first got his CPAP he received nasal mask, this was later changed full face mask as he is a mouth breather. No issue with current mask or pressure setting. He still feels tired in the morning but fatigue does not last as long. He lives in [place]. He travels a lot for work. He was in [place] for the last 3 months and is currently in [place].   Airview download 04/03/21-05/02/21 Usage 29/30 days; 93% > 4 hours  Pressure 5-15cm h20 (11.9cm h30- 95%) Airleaks 5.5L/min (95%) AHI 3.1    Observations/Objective:  - Appears well; no overt shortness of breath or wheezing  Assessment and Plan:  Mild OSA: - HST on 08/09/20 showed mild OSA, AHI 13 with SpO2 85%. - Patient is  93% compliant with CPAP > 4 hours - Pressure 5-15cm h20; Residual AHI 3.1  - Continue to encourage patient ear CPAP every night for 4-6 hours or longer and advised against driving if experiencing excessive daytime sleepiness - No changes today  Follow Up Instructions:  - 6 months with Beth    I discussed the assessment and treatment plan with the patient. The patient was provided an opportunity to ask questions and all were answered. The patient agreed with the plan and demonstrated an understanding of the instructions.   The patient was advised to call back or seek an in-person evaluation if the symptoms worsen or if the condition fails to improve as anticipated.  I provided 20 minutes of non-face-to-face time during this encounter.   Glenford Bayley, NP

## 2021-05-03 NOTE — Progress Notes (Signed)
Reviewed and agree with assessment/plan.   Coralyn Helling, MD [facility] Pulmonary/Critical Care 05/03/2021, 1:08 PM Pager:  [phone]

## 2021-05-03 NOTE — Patient Instructions (Signed)
No changed Continue to wear CPAP every night for 4-6 hours or longer Follow up in 6 months or sooner

## 2021-05-12 DIAGNOSIS — G4733 Obstructive sleep apnea (adult) (pediatric): Secondary | ICD-10-CM | POA: Diagnosis not present

## 2021-06-11 DIAGNOSIS — G4733 Obstructive sleep apnea (adult) (pediatric): Secondary | ICD-10-CM | POA: Diagnosis not present

## 2021-07-12 DIAGNOSIS — G4733 Obstructive sleep apnea (adult) (pediatric): Secondary | ICD-10-CM | POA: Diagnosis not present

## 2021-08-10 ENCOUNTER — Encounter: Payer: Self-pay | Admitting: Adult Health

## 2021-08-10 ENCOUNTER — Other Ambulatory Visit: Payer: Self-pay

## 2021-08-10 ENCOUNTER — Ambulatory Visit: Payer: BC Managed Care – PPO | Admitting: Adult Health

## 2021-08-10 VITALS — BP 130/88 | HR 65 | Temp 97.8°F | Ht 71.0 in | Wt 190.2 lb

## 2021-08-10 DIAGNOSIS — Z23 Encounter for immunization: Secondary | ICD-10-CM | POA: Diagnosis not present

## 2021-08-10 DIAGNOSIS — Z0000 Encounter for general adult medical examination without abnormal findings: Secondary | ICD-10-CM

## 2021-08-10 DIAGNOSIS — G4730 Sleep apnea, unspecified: Secondary | ICD-10-CM

## 2021-08-10 DIAGNOSIS — Z1159 Encounter for screening for other viral diseases: Secondary | ICD-10-CM | POA: Diagnosis not present

## 2021-08-10 LAB — COMPREHENSIVE METABOLIC PANEL
ALT: 12 U/L (ref 0–53)
AST: 16 U/L (ref 0–37)
Albumin: 4.7 g/dL (ref 3.5–5.2)
Alkaline Phosphatase: 45 U/L (ref 39–117)
BUN: 14 mg/dL (ref 6–23)
CO2: 29 mEq/L (ref 19–32)
Calcium: 9.8 mg/dL (ref 8.4–10.5)
Chloride: 101 mEq/L (ref 96–112)
Creatinine, Ser: 0.92 mg/dL (ref 0.40–1.50)
GFR: 108.9 mL/min (ref >60.00)
Glucose, Bld: 95 mg/dL (ref 70–99)
Potassium: 4.1 mEq/L (ref 3.5–5.1)
Sodium: 138 mEq/L (ref 135–145)
Total Bilirubin: 0.9 mg/dL (ref 0.2–1.2)
Total Protein: 7.9 g/dL (ref 6.0–8.3)

## 2021-08-10 LAB — LIPID PANEL
Cholesterol: 194 mg/dL (ref 0–200)
HDL: 48.5 mg/dL (ref >39.00)
LDL Cholesterol: 132 mg/dL — ABNORMAL HIGH (ref 0–99)
NonHDL: 145.93
Total CHOL/HDL Ratio: 4
Triglycerides: 71 mg/dL (ref 0.0–149.0)
VLDL: 14.2 mg/dL (ref 0.0–40.0)

## 2021-08-10 LAB — CBC WITH DIFFERENTIAL/PLATELET
Basophils Absolute: 0 10*3/uL (ref 0.0–0.1)
Basophils Relative: 0.5 % (ref 0.0–3.0)
Eosinophils Absolute: 0.1 10*3/uL (ref 0.0–0.7)
Eosinophils Relative: 2.5 % (ref 0.0–5.0)
HCT: 41 % (ref 39.0–52.0)
Hemoglobin: 14.1 g/dL (ref 13.0–17.0)
Lymphocytes Relative: 35 % (ref 12.0–46.0)
Lymphs Abs: 1.9 10*3/uL (ref 0.7–4.0)
MCHC: 34.3 g/dL (ref 30.0–36.0)
MCV: 88.5 fl (ref 78.0–100.0)
Monocytes Absolute: 0.4 10*3/uL (ref 0.1–1.0)
Monocytes Relative: 7 % (ref 3.0–12.0)
Neutro Abs: 3 10*3/uL (ref 1.4–7.7)
Neutrophils Relative %: 55 % (ref 43.0–77.0)
Platelets: 264 10*3/uL (ref 150.0–400.0)
RBC: 4.64 Mil/uL (ref 4.22–5.81)
RDW: 12.4 % (ref 11.5–15.5)
WBC: 5.5 10*3/uL (ref 4.0–10.5)

## 2021-08-10 LAB — TSH: TSH: 1.55 u[IU]/mL (ref 0.35–5.50)

## 2021-08-10 NOTE — Progress Notes (Signed)
Subjective:    Patient ID: Timothy Coffey, male    DOB: 1986-08-11, 35 y.o.   MRN: [id number]  HPI Patient presents for yearly preventative medicine examination. He is a pleasant 35 year old male who  has a past medical history of Allergy, Bradycardia, GERD (gastroesophageal reflux disease), and Obesity.  OSA -was diagnosed with mild OSA in February 2022.  Currently using CPAP with full facemask.  Feels as though his settings are correct.  Has some mild fatigue in the morning but this does not last long.  He continues to do a lot of traveling for work   All immunizations and health maintenance protocols were reviewed with the patient and needed orders were placed.  Appropriate screening laboratory values were ordered for the patient including screening of hyperlipidemia, renal function and hepatic function.  Medication reconciliation,  past medical history, social history, problem list and allergies were reviewed in detail with the patient  Goals were established with regard to weight loss, exercise, and  diet in compliance with medications. He has been eating healthier and exercising and has been able to lose significant weight.   Wt Readings from Last 3 Encounters:  08/10/21 190 lb 3.2 oz (86.3 kg)  05/03/21 180 lb (81.6 kg)  08/27/20 228 lb (103.4 kg)   Review of Systems  Constitutional: Negative.   HENT: Negative.    Eyes: Negative.   Respiratory: Negative.    Cardiovascular: Negative.   Gastrointestinal: Negative.   Endocrine: Negative.   Genitourinary: Negative.   Musculoskeletal: Negative.   Skin: Negative.   Allergic/Immunologic: Negative.   Neurological: Negative.   Hematological: Negative.   Psychiatric/Behavioral: Negative.    All other systems reviewed and are negative.  Past Medical History:  Diagnosis Date   Allergy    Bradycardia    GERD (gastroesophageal reflux disease)    Obesity     Social History   Socioeconomic History   Marital status:  Married    Spouse name: Not on file   Number of children: Not on file   Years of education: Not on file   Highest education level: 12th grade  Occupational History   Occupation: Personnel officer  Tobacco Use   Smoking status: Former    Packs/day: 0.50    Years: 3.00    Pack years: 1.50    Types: Cigarettes    Quit date: 2008    Years since quitting: 15.0   Smokeless tobacco: Never  Vaping Use   Vaping Use: Never used  Substance and Sexual Activity   Alcohol use: Yes    Alcohol/week: 0.0 standard drinks    Comment: 2-6 beers per day   Drug use: No   Sexual activity: Not on file  Other Topics Concern   Not on file  Social History Narrative   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[employer]   Married    Two children       He likes to be outside    Social Determinants of Corporate investment banker Strain: Low Risk    Difficulty of Paying Living Expenses: Not hard at all  Food Insecurity: No Food Insecurity   Worried About Programme researcher, broadcasting/film/video in the Last Year: Never true   Barista in the Last Year: Never true  Transportation Needs: No Transportation Needs   Lack of Transportation (Medical): No   Lack of Transportation (Non-Medical): No  Physical Activity: Sufficiently Active   Days of Exercise per Week: 5 days  Minutes of Exercise per Session: 30 min  Stress: No Stress Concern Present   Feeling of Stress : Only a little  Social Connections: Moderately Isolated   Frequency of Communication with Friends and Family: More than three times a week   Frequency of Social Gatherings with Friends and Family: Once a week   Attends Religious Services: Never   Database administrator or Organizations: No   Attends Engineer, structural: Not on file   Marital Status: Married  Catering manager Violence: Not on file    No past surgical history on file.  Family History  Problem Relation Age of Onset   Arthritis Father    Heart disease Father    Skin cancer Father     Hypertension Other        Family history    Hyperlipidemia Other        Family History   Thyroid disease Mother     Allergies  Allergen Reactions   Shellfish Allergy Other (See Comments)    "abdominal pain"    No current outpatient medications on file prior to visit.   No current facility-administered medications on file prior to visit.    BP 130/88    Pulse 65    Temp 97.8 F (36.6 C) (Oral)    Ht 5\' 11"  (1.803 m)    Wt 190 lb 3.2 oz (86.3 kg)    SpO2 99%    BMI 26.53 kg/m       Objective:   Physical Exam Vitals and nursing note reviewed.  Constitutional:      General: He is not in acute distress.    Appearance: Normal appearance. He is well-developed and normal weight.  HENT:     Head: Normocephalic and atraumatic.     Right Ear: Tympanic membrane, ear canal and external ear normal. There is no impacted cerumen.     Left Ear: Tympanic membrane, ear canal and external ear normal. There is no impacted cerumen.     Nose: Nose normal. No congestion or rhinorrhea.     Mouth/Throat:     Mouth: Mucous membranes are moist.     Pharynx: Oropharynx is clear. No oropharyngeal exudate or posterior oropharyngeal erythema.  Eyes:     General:        Right eye: No discharge.        Left eye: No discharge.     Extraocular Movements: Extraocular movements intact.     Conjunctiva/sclera: Conjunctivae normal.     Pupils: Pupils are equal, round, and reactive to light.  Neck:     Vascular: No carotid bruit.     Trachea: No tracheal deviation.  Cardiovascular:     Rate and Rhythm: Normal rate and regular rhythm.     Pulses: Normal pulses.     Heart sounds: Normal heart sounds. No murmur heard.   No friction rub. No gallop.  Pulmonary:     Effort: Pulmonary effort is normal. No respiratory distress.     Breath sounds: Normal breath sounds. No stridor. No wheezing, rhonchi or rales.  Chest:     Chest wall: No tenderness.  Abdominal:     General: Bowel sounds are normal. There is  no distension.     Palpations: Abdomen is soft. There is no mass.     Tenderness: There is no abdominal tenderness. There is no right CVA tenderness, left CVA tenderness, guarding or rebound.     Hernia: No hernia is present.  Musculoskeletal:  General: No swelling, tenderness, deformity or signs of injury. Normal range of motion.     Right lower leg: No edema.     Left lower leg: No edema.  Lymphadenopathy:     Cervical: No cervical adenopathy.  Skin:    General: Skin is warm and dry.     Capillary Refill: Capillary refill takes less than 2 seconds.     Coloration: Skin is not jaundiced or pale.     Findings: No bruising, erythema, lesion or rash.  Neurological:     General: No focal deficit present.     Mental Status: He is alert and oriented to person, place, and time.     Cranial Nerves: No cranial nerve deficit.     Sensory: No sensory deficit.     Motor: No weakness.     Coordination: Coordination normal.     Gait: Gait normal.     Deep Tendon Reflexes: Reflexes normal.  Psychiatric:        Mood and Affect: Mood normal.        Behavior: Behavior normal.        Thought Content: Thought content normal.        Judgment: Judgment normal.      Assessment & Plan:  1. Routine general medical examination at a health care facility - Congratulated on weight loss - Continue with lifestyle modifications  - Follow up in one year or sooner if needed - CBC with Differential/Platelet; Future - Comprehensive metabolic panel; Future - Lipid panel; Future - TSH; Future  2. Sleep apnea, unspecified type - Continue with CPAP - Follow up with pulmonary as directed  3. Need for hepatitis C screening test  - Hep C Antibody; Future   4. Need for immunization against influenza  - Flu Vaccine QUAD 29mo+IM (Fluarix, Fluzone & Alfiuria Quad PF)  Shirline Frees, NP

## 2021-08-10 NOTE — Patient Instructions (Signed)
It was great seeing you today   We will follow up with you regarding your lab work   Please let me know if you need anything   

## 2021-08-11 DIAGNOSIS — G4733 Obstructive sleep apnea (adult) (pediatric): Secondary | ICD-10-CM | POA: Diagnosis not present

## 2021-08-11 LAB — HEPATITIS C ANTIBODY
Hepatitis C Ab: NONREACTIVE
SIGNAL TO CUT-OFF: 0.03 (ref <1.00)

## 2021-11-03 ENCOUNTER — Telehealth: Payer: BC Managed Care – PPO | Admitting: Primary Care

## 2021-11-03 ENCOUNTER — Other Ambulatory Visit: Payer: Self-pay

## 2021-11-03 ENCOUNTER — Encounter: Payer: Self-pay | Admitting: Primary Care

## 2021-11-03 DIAGNOSIS — G4733 Obstructive sleep apnea (adult) (pediatric): Secondary | ICD-10-CM

## 2021-11-03 DIAGNOSIS — Z9989 Dependence on other enabling machines and devices: Secondary | ICD-10-CM | POA: Diagnosis not present

## 2021-11-03 HISTORY — DX: Obstructive sleep apnea (adult) (pediatric): G47.33

## 2021-11-03 NOTE — Progress Notes (Signed)
Virtual Visit via Video Note ? ?I connected with Timothy Coffey on 11/03/21 at 10:00 AM EDT by a video enabled telemedicine application and verified that I am speaking with the correct person using two identifiers. ? ?Location: ?Patient: Home ?Provider: Office  ?  ?I discussed the limitations of evaluation and management by telemedicine and the availability of in person appointments. The patient expressed understanding and agreed to proceed. ? ?History of Present Illness: ?36 year old male, former smoker. PMH significant for GERD, seizure disorder, snoring. Patient of Dr. Craige Cotta, seen for initial consult on 03/30/20. ? ?Previous [facility] pulmonary encounter:  ?10/04/2020 ?Patient contacted today to review home sleep study. HST on 08/09/20 showed mild OSA, AHI 13 with SpO2 85%. Reports symptoms of snoring, daytime somnolence and dry mouth. We reviewed treatment options including weight loss, side sleeping position, oral appliance CPAP or referral to ENT. He would like to proceed with CPAP therapy d/t severity of his symptoms. He has been unsuccessful with weight loss in the past.   ? ?05/03/2021- Interim hx  ?Patient contacted today for 6 month follow-up for mild OSA. He is doing well, no acute complaints. He received CPAP machine in July. When he first got his CPAP he received nasal mask, this was later changed full face mask as he is a mouth breather. No issue with current mask or pressure setting. He still feels tired in the morning but fatigue does not last as long. He lives in [place]. He travels a lot for work. He was in [place] for the last 3 months and is currently in [place].  ? ?Airview download 04/03/21-05/02/21 ?Usage 29/30 days; 93% > 4 hours  ?Pressure 5-15cm h20 (11.9cm h30- 95%) ?Airleaks 5.5L/min (95%) ?AHI 3.1  ? ? ?11/03/2021- interim hx  ?Patient contacted today for virtual video visit for OSA follow-up.  He is doing well without acute complaints.  He is 100% compliant with CPAP use.   No issues with  mask fit or pressure settings.  He has noticed an improvement in daytime fatigue, states that he is not as tired in the afternoon as he used to be. He will occasionally wake up in the middle of the night due to air leaks. DME company is [employer].  ? ?Airview download 10/04/21-11/02/21 ?30/30 days (100%) > 4 hours ?Average usage 7 hours 29 mins ?Pressure 5-15cm h20 (9.7cm h20-95%) ?Airleaks 1.9L/min ?AHI 1.9 ? ? ?Observations/Objective: ? ?- Appear well; No acute respiratory symptoms ? ?Assessment and Plan: ? ?OSA: ?- HST on 08/09/20 showed mild OSA, AHI 13 with SpO2 85% ?- Patient is 100% compliant with CPAP and reports benefit from use ?- Pressure 5-15cm h20; Residual AHI 1.9 ?- No changes ?- Continue CPAP everynight for 4-6 hours or longer ?- Advised against driving if experiencing excessive daytime sleepiness ?- Renew CPAP supplies with DME company  ? ?Follow Up Instructions: ? ?1 year follow-up  ?  ?I discussed the assessment and treatment plan with the patient. The patient was provided an opportunity to ask questions and all were answered. The patient agreed with the plan and demonstrated an understanding of the instructions. ?  ?The patient was advised to call back or seek an in-person evaluation if the symptoms worsen or if the condition fails to improve as anticipated. ? ?I provided 20 minutes of non-face-to-face time during this encounter. ? ? ?Glenford Bayley, NP ? ?

## 2021-11-03 NOTE — Progress Notes (Signed)
Reviewed and agree with assessment/plan. ? ? ?Shakyla Nolley, MD ?[facility]/Critical Care ?11/03/2021, 11:21 AM ?Pager:  [phone] ? ?

## 2021-11-09 DIAGNOSIS — G4733 Obstructive sleep apnea (adult) (pediatric): Secondary | ICD-10-CM | POA: Diagnosis not present

## 2022-01-16 ENCOUNTER — Encounter: Payer: Self-pay | Admitting: Adult Health

## 2022-01-30 DIAGNOSIS — Z3009 Encounter for other general counseling and advice on contraception: Secondary | ICD-10-CM | POA: Diagnosis not present

## 2022-02-09 DIAGNOSIS — G4733 Obstructive sleep apnea (adult) (pediatric): Secondary | ICD-10-CM | POA: Diagnosis not present

## 2022-05-12 DIAGNOSIS — G4733 Obstructive sleep apnea (adult) (pediatric): Secondary | ICD-10-CM | POA: Diagnosis not present

## 2022-06-07 ENCOUNTER — Encounter: Payer: BC Managed Care – PPO | Admitting: Adult Health

## 2022-08-10 ENCOUNTER — Ambulatory Visit (INDEPENDENT_AMBULATORY_CARE_PROVIDER_SITE_OTHER): Payer: BC Managed Care – PPO | Admitting: Adult Health

## 2022-08-10 ENCOUNTER — Encounter: Payer: Self-pay | Admitting: Adult Health

## 2022-08-10 VITALS — BP 118/74 | HR 58 | Temp 97.9°F | Ht 71.0 in | Wt 179.0 lb

## 2022-08-10 DIAGNOSIS — Z0000 Encounter for general adult medical examination without abnormal findings: Secondary | ICD-10-CM | POA: Diagnosis not present

## 2022-08-10 DIAGNOSIS — E785 Hyperlipidemia, unspecified: Secondary | ICD-10-CM

## 2022-08-10 DIAGNOSIS — G4730 Sleep apnea, unspecified: Secondary | ICD-10-CM | POA: Diagnosis not present

## 2022-08-10 LAB — COMPREHENSIVE METABOLIC PANEL
ALT: 12 U/L (ref 0–53)
AST: 11 U/L (ref 0–37)
Albumin: 4.9 g/dL (ref 3.5–5.2)
Alkaline Phosphatase: 41 U/L (ref 39–117)
BUN: 15 mg/dL (ref 6–23)
CO2: 29 mEq/L (ref 19–32)
Calcium: 9.8 mg/dL (ref 8.4–10.5)
Chloride: 101 mEq/L (ref 96–112)
Creatinine, Ser: 0.92 mg/dL (ref 0.40–1.50)
GFR: 108.14 mL/min (ref >60.00)
Glucose, Bld: 88 mg/dL (ref 70–99)
Potassium: 3.7 mEq/L (ref 3.5–5.1)
Sodium: 138 mEq/L (ref 135–145)
Total Bilirubin: 0.9 mg/dL (ref 0.2–1.2)
Total Protein: 7.9 g/dL (ref 6.0–8.3)

## 2022-08-10 LAB — CBC WITH DIFFERENTIAL/PLATELET
Basophils Absolute: 0 10*3/uL (ref 0.0–0.1)
Basophils Relative: 0.4 % (ref 0.0–3.0)
Eosinophils Absolute: 0.1 10*3/uL (ref 0.0–0.7)
Eosinophils Relative: 1.4 % (ref 0.0–5.0)
HCT: 42.3 % (ref 39.0–52.0)
Hemoglobin: 14.6 g/dL (ref 13.0–17.0)
Lymphocytes Relative: 30 % (ref 12.0–46.0)
Lymphs Abs: 2.4 10*3/uL (ref 0.7–4.0)
MCHC: 34.6 g/dL (ref 30.0–36.0)
MCV: 88.5 fl (ref 78.0–100.0)
Monocytes Absolute: 0.4 10*3/uL (ref 0.1–1.0)
Monocytes Relative: 5.3 % (ref 3.0–12.0)
Neutro Abs: 5 10*3/uL (ref 1.4–7.7)
Neutrophils Relative %: 62.9 % (ref 43.0–77.0)
Platelets: 276 10*3/uL (ref 150.0–400.0)
RBC: 4.78 Mil/uL (ref 4.22–5.81)
RDW: 12.4 % (ref 11.5–15.5)
WBC: 7.9 10*3/uL (ref 4.0–10.5)

## 2022-08-10 LAB — LIPID PANEL
Cholesterol: 190 mg/dL (ref 0–200)
HDL: 54.6 mg/dL (ref >39.00)
LDL Cholesterol: 120 mg/dL — ABNORMAL HIGH (ref 0–99)
NonHDL: 135.31
Total CHOL/HDL Ratio: 3
Triglycerides: 76 mg/dL (ref 0.0–149.0)
VLDL: 15.2 mg/dL (ref 0.0–40.0)

## 2022-08-10 NOTE — Patient Instructions (Signed)
It was great seeing you today   We will follow up with you regarding your lab work   Please let me know if you need anything   

## 2022-08-10 NOTE — Progress Notes (Signed)
Subjective:    Patient ID: Timothy Coffey, male    DOB: Mar 03, 1986, 36 y.o.   MRN: [id number]  HPI Patient presents for yearly preventative medicine examination. He is a pleasant 36 year old male who  has a past medical history of Allergy, Bradycardia, GERD (gastroesophageal reflux disease), Obesity, and OSA on CPAP (11/03/2021).  OSA -he was diagnosed with mild OSA in February 2022.  Using CPAP with full facemask.  Hyperlipidemia - mildly elevated LDL cholesterol. Not currently on any medication   All immunizations and health maintenance protocols were reviewed with the patient and needed orders were placed.  Appropriate screening laboratory values were ordered for the patient including screening of hyperlipidemia, renal function and hepatic function.  Medication reconciliation,  past medical history, social history, problem list and allergies were reviewed in detail with the patient  Goals were established with regard to weight loss, exercise, and  diet in compliance with medications. He eats healthy and exercises on a regular basis   Wt Readings from Last 10 Encounters:  08/10/22 179 lb (81.2 kg)  08/10/21 190 lb 3.2 oz (86.3 kg)  05/03/21 180 lb (81.6 kg)  08/27/20 228 lb (103.4 kg)  03/30/20 230 lb 9.6 oz (104.6 kg)  01/30/20 215 lb (97.5 kg)  01/23/20 218 lb 14.7 oz (99.3 kg)  01/20/20 219 lb (99.3 kg)  05/08/19 238 lb 12.8 oz (108.3 kg)  10/04/18 245 lb (111.1 kg)   Review of Systems  Constitutional: Negative.   HENT: Negative.    Eyes: Negative.   Respiratory: Negative.    Cardiovascular: Negative.   Gastrointestinal: Negative.   Endocrine: Negative.   Genitourinary: Negative.   Musculoskeletal: Negative.   Skin: Negative.   Allergic/Immunologic: Negative.   Neurological: Negative.   Hematological: Negative.   Psychiatric/Behavioral: Negative.    All other systems reviewed and are negative.  Past Medical History:  Diagnosis Date   Allergy    Bradycardia     GERD (gastroesophageal reflux disease)    Obesity    OSA on CPAP 11/03/2021    Social History   Socioeconomic History   Marital status: Married    Spouse name: Not on file   Number of children: Not on file   Years of education: Not on file   Highest education level: 12th grade  Occupational History   Occupation: Personnel officer  Tobacco Use   Smoking status: Former    Packs/day: 0.50    Years: 3.00    Total pack years: 1.50    Types: Cigarettes    Quit date: 2008    Years since quitting: 16.0   Smokeless tobacco: Never  Vaping Use   Vaping Use: Never used  Substance and Sexual Activity   Alcohol use: Yes    Alcohol/week: 0.0 standard drinks of alcohol    Comment: 2-6 beers per day   Drug use: No   Sexual activity: Not on file  Other Topics Concern   Not on file  Social History Narrative   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[employer]   Married    Two children       He likes to be outside    Social Determinants of Health   Financial Resource Strain: Low Risk  (08/06/2021)   Overall Financial Resource Strain (CARDIA)    Difficulty of Paying Living Expenses: Not hard at all  Food Insecurity: No Food Insecurity (08/06/2021)   Hunger Vital Sign    Worried About Running Out of Food in the Last  Year: Never true    Ran Out of Food in the Last Year: Never true  Transportation Needs: No Transportation Needs (08/06/2021)   PRAPARE - Administrator, Civil Service (Medical): No    Lack of Transportation (Non-Medical): No  Physical Activity: Sufficiently Active (08/06/2021)   Exercise Vital Sign    Days of Exercise per Week: 5 days    Minutes of Exercise per Session: 30 min  Stress: No Stress Concern Present (08/06/2021)   [employer] of Occupational Health - Occupational Stress Questionnaire    Feeling of Stress : Only a little  Social Connections: Moderately Isolated (08/06/2021)   Social Connection and Isolation Panel [NHANES]    Frequency of Communication  with Friends and Family: More than three times a week    Frequency of Social Gatherings with Friends and Family: Once a week    Attends Religious Services: Never    Database administrator or Organizations: No    Attends Engineer, structural: Not on file    Marital Status: Married  Catering manager Violence: Not on file    History reviewed. No pertinent surgical history.  Family History  Problem Relation Age of Onset   Arthritis Father    Heart disease Father    Skin cancer Father    Hypertension Other        Family history    Hyperlipidemia Other        Family History   Thyroid disease Mother     Allergies  Allergen Reactions   Shellfish Allergy Other (See Comments)    "abdominal pain"    Current Outpatient Medications on File Prior to Visit  Medication Sig Dispense Refill   fluticasone (FLONASE) 50 MCG/ACT nasal spray Place into both nostrils daily.     ketoconazole (NIZORAL) 2 % cream as needed.     No current facility-administered medications on file prior to visit.    BP (!) 120/100   Pulse (!) 58   Temp 97.9 F (36.6 C) (Oral)   Ht 5\' 11"  (1.803 m)   Wt 179 lb (81.2 kg)   SpO2 95%   BMI 24.97 kg/m      Objective:   Physical Exam Vitals and nursing note reviewed.  Constitutional:      General: He is not in acute distress.    Appearance: Normal appearance. He is well-developed and normal weight.  HENT:     Head: Normocephalic and atraumatic.     Right Ear: Tympanic membrane, ear canal and external ear normal. There is no impacted cerumen.     Left Ear: Tympanic membrane, ear canal and external ear normal. There is no impacted cerumen.     Nose: Nose normal. No congestion or rhinorrhea.     Mouth/Throat:     Mouth: Mucous membranes are moist.     Pharynx: Oropharynx is clear. No oropharyngeal exudate or posterior oropharyngeal erythema.  Eyes:     General:        Right eye: No discharge.        Left eye: No discharge.     Extraocular  Movements: Extraocular movements intact.     Conjunctiva/sclera: Conjunctivae normal.     Pupils: Pupils are equal, round, and reactive to light.  Neck:     Vascular: No carotid bruit.     Trachea: No tracheal deviation.  Cardiovascular:     Rate and Rhythm: Normal rate and regular rhythm.     Pulses: Normal pulses.  Heart sounds: Normal heart sounds. No murmur heard.    No friction rub. No gallop.  Pulmonary:     Effort: Pulmonary effort is normal. No respiratory distress.     Breath sounds: Normal breath sounds. No stridor. No wheezing, rhonchi or rales.  Chest:     Chest wall: No tenderness.  Abdominal:     General: Bowel sounds are normal. There is no distension.     Palpations: Abdomen is soft. There is no mass.     Tenderness: There is no abdominal tenderness. There is no right CVA tenderness, left CVA tenderness, guarding or rebound.     Hernia: No hernia is present.  Musculoskeletal:        General: No swelling, tenderness, deformity or signs of injury. Normal range of motion.     Right lower leg: No edema.     Left lower leg: No edema.  Lymphadenopathy:     Cervical: No cervical adenopathy.  Skin:    General: Skin is warm and dry.     Capillary Refill: Capillary refill takes less than 2 seconds.     Coloration: Skin is not jaundiced or pale.     Findings: No bruising, erythema, lesion or rash.  Neurological:     General: No focal deficit present.     Mental Status: He is alert and oriented to person, place, and time.     Cranial Nerves: No cranial nerve deficit.     Sensory: No sensory deficit.     Motor: No weakness.     Coordination: Coordination normal.     Gait: Gait normal.     Deep Tendon Reflexes: Reflexes normal.  Psychiatric:        Mood and Affect: Mood normal.        Behavior: Behavior normal.        Thought Content: Thought content normal.        Judgment: Judgment normal.       Assessment & Plan:  1. Routine general medical examination at a  health care facility - benign exam. Healthy 36 year old male  - Flu and Tdap given today  - CBC with Differential/Platelet - Comprehensive metabolic panel - Lipid panel  2. Sleep apnea, unspecified type - Continue with CPAP  - CBC with Differential/Platelet - Comprehensive metabolic panel - Lipid panel  3. Hyperlipidemia, unspecified hyperlipidemia type - Consider statin - CBC with Differential/Platelet - Comprehensive metabolic panel - Lipid panel  Shirline Frees, NP

## 2022-08-11 ENCOUNTER — Encounter: Payer: BC Managed Care – PPO | Admitting: Adult Health

## 2022-08-11 DIAGNOSIS — G4733 Obstructive sleep apnea (adult) (pediatric): Secondary | ICD-10-CM | POA: Diagnosis not present

## 2022-10-19 ENCOUNTER — Encounter: Payer: Self-pay | Admitting: Adult Health

## 2022-10-20 ENCOUNTER — Other Ambulatory Visit: Payer: Self-pay | Admitting: Adult Health

## 2022-10-20 MED ORDER — KETOCONAZOLE 2 % EX CREA: TOPICAL_CREAM | Freq: Every day | CUTANEOUS | 0 refills | Status: AC | PRN

## 2022-10-20 MED ORDER — KETOCONAZOLE 2 % EX SHAM: 1.0000 | MEDICATED_SHAMPOO | CUTANEOUS | 0 refills | Status: AC

## 2022-11-10 ENCOUNTER — Telehealth: Payer: BC Managed Care – PPO | Admitting: Primary Care

## 2022-11-10 ENCOUNTER — Encounter: Payer: Self-pay | Admitting: Primary Care

## 2022-11-10 DIAGNOSIS — G4733 Obstructive sleep apnea (adult) (pediatric): Secondary | ICD-10-CM | POA: Diagnosis not present

## 2022-11-10 NOTE — Progress Notes (Signed)
Virtual Visit via Video Note  I connected with Timothy Coffey on 11/10/22 at  1:30 PM EDT by a video enabled telemedicine application and verified that I am speaking with the correct person using two identifiers.  Location: Patient: Home Provider: Office    I discussed the limitations of evaluation and management by telemedicine and the availability of in person appointments. The patient expressed understanding and agreed to proceed.  History of Present Illness: 37 year old male, former smoker. PMH significant for GERD, seizure disorder, snoring. Patient of Dr. Halford Chessman, seen for initial consult on 03/30/20.  Previous [facility] pulmonary encounter:  10/04/2020 Patient contacted today to review home sleep study. HST on 08/09/20 showed mild OSA, AHI 13 with SpO2 85%. Reports symptoms of snoring, daytime somnolence and dry mouth. We reviewed treatment options including weight loss, side sleeping position, oral appliance CPAP or referral to ENT. He would like to proceed with CPAP therapy d/t severity of his symptoms. He has been unsuccessful with weight loss in the past.    05/03/2021 Patient contacted today for 6 month follow-up for mild OSA. He is doing well, no acute complaints. He received CPAP machine in July. When he first got his CPAP he received nasal mask, this was later changed full face mask as he is a mouth breather. No issue with current mask or pressure setting. He still feels tired in the morning but fatigue does not last as long. He lives in [place]. He travels a lot for work. He was in [place] for the last 3 months and is currently in [place].   [employer] download 04/03/21-05/02/21 Usage 29/30 days; 93% > 4 hours  Pressure 5-15cm h20 (11.9cm h30- 95%) Airleaks 5.5L/min (95%) AHI 3.1   11/03/2021 Patient contacted today for virtual video visit for OSA follow-up.  He is doing well without acute complaints.  He is 100% compliant with CPAP use.   No issues with mask fit or pressure settings.   He has noticed an improvement in daytime fatigue, states that he is not as tired in the afternoon as he used to be. He will occasionally wake up in the middle of the night due to air leaks. DME company is [employer].   [employer] download 10/04/21-11/02/21 30/30 days (100%) > 4 hours Average usage 7 hours 29 mins Pressure 5-15cm h20 (9.7cm h20-95%) Airleaks 1.9L/min AHI 1.9  11/10/2022- Interim hx  Patient contacted today for annual follow-up/OSA. HST on 08/09/20 showed mild OSA, AHI 13 with SpO2 85%. He is maintained on auto CPAP 5-15cm h20. He is compliant with CPAP and reports benefit from use. He reports having more energy, he is not as tired in the evening. He sleeps well most nights. No issues with pressure settings or mask fit. He is a mouth breather. He uses full face mask. No dry mouth. DME company is [employer] download 10/10/2022 - 11/08/2022 Usage days 30/30 days (100%); 28 days (93%) greater than 4 hours Average usage 7 hours 1 minute Pressure 5 to 15 cm H2O (9.2 cm H2O-95%) Air leaks 4.2 L/min (95%) AHI 1.3  Observations/Objective:  Appears well; No overt shortness of breath or wheezing   Assessment and Plan:  Mild sleep apnea: - Stable interval; Patient reports improvement in daytime sleepiness with CPAP therapy - HST on 08/09/20 showed mild OSA, AHI 13 with SpO2 85% - No issues with mask fit or pressure settings - Current CPAP pressure 5-15cm h20 (9.2cm h20-95%); Residual AHI 1.3/hour - No changes needed. Renew  CPAP supplies  - Advised patient continue to wear CPAP nightly 4-6 hours or longer  - Encourage weight loss efforts and side sleeping position  Follow Up Instructions:   - 1 year with [facility] NP or sooner if needed   I discussed the assessment and treatment plan with the patient. The patient was provided an opportunity to ask questions and all were answered. The patient agreed with the plan and demonstrated an understanding of the instructions.   The patient was  advised to call back or seek an in-person evaluation if the symptoms worsen or if the condition fails to improve as anticipated.  I provided 20 minutes of non-face-to-face time during this encounter.   Martyn Ehrich, NP

## 2023-02-11 DIAGNOSIS — G4733 Obstructive sleep apnea (adult) (pediatric): Secondary | ICD-10-CM | POA: Diagnosis not present

## 2023-05-14 DIAGNOSIS — G4733 Obstructive sleep apnea (adult) (pediatric): Secondary | ICD-10-CM | POA: Diagnosis not present

## 2023-08-13 DIAGNOSIS — G4733 Obstructive sleep apnea (adult) (pediatric): Secondary | ICD-10-CM | POA: Diagnosis not present

## 2023-11-12 DIAGNOSIS — G4733 Obstructive sleep apnea (adult) (pediatric): Secondary | ICD-10-CM | POA: Diagnosis not present

## 2024-01-01 ENCOUNTER — Telehealth: Admitting: Primary Care

## 2024-01-01 ENCOUNTER — Encounter: Payer: Self-pay | Admitting: Primary Care

## 2024-01-01 VITALS — Ht 71.5 in | Wt 200.0 lb

## 2024-01-01 DIAGNOSIS — G4733 Obstructive sleep apnea (adult) (pediatric): Secondary | ICD-10-CM | POA: Diagnosis not present

## 2024-01-01 NOTE — Progress Notes (Signed)
 Virtual Visit via Video Note  I connected with Timothy Coffey on 01/01/24 at 10:00 AM EDT by a video enabled telemedicine application and verified that I am speaking with the correct person using two identifiers.  Location: Patient: Home Provider: Office   I discussed the limitations of evaluation and management by telemedicine and the availability of in person appointments. The patient expressed understanding and agreed to proceed.  History of Present Illness: 38 year old male, former smoker. PMH significant for GERD, seizure disorder, snoring. Patient of Dr. Matilde Son, seen for initial consult on 03/30/20.  Previous [facility] pulmonary encounter:  10/04/2020 Patient contacted today to review home sleep study. HST on 08/09/20 showed mild OSA, AHI 13 with SpO2 85%. Reports symptoms of snoring, daytime somnolence and dry mouth. We reviewed treatment options including weight loss, side sleeping position, oral appliance CPAP or referral to ENT. He would like to proceed with CPAP therapy d/t severity of his symptoms. He has been unsuccessful with weight loss in the past.    05/03/2021 Patient contacted today for 6 month follow-up for mild OSA. He is doing well, no acute complaints. He received CPAP machine in July. When he first got his CPAP he received nasal mask, this was later changed full face mask as he is a mouth breather. No issue with current mask or pressure setting. He still feels tired in the morning but fatigue does not last as long. He lives in [place] . He travels a lot for work. He was in [place] for the last 3 months and is currently in [place].   [employer] download 04/03/21-05/02/21 Usage 29/30 days; 93% > 4 hours  Pressure 5-15cm h20 (11.9cm h30- 95%) Airleaks 5.5L/min (95%) AHI 3.1   11/03/2021 Patient contacted today for virtual video visit for OSA follow-up.  He is doing well without acute complaints.  He is 100% compliant with CPAP use.   No issues with mask fit or pressure settings.   He has noticed an improvement in daytime fatigue, states that he is not as tired in the afternoon as he used to be. He will occasionally wake up in the middle of the night due to air leaks. DME company is [employer].   [employer] download 10/04/21-11/02/21 30/30 days (100%) > 4 hours Average usage 7 hours 29 mins Pressure 5-15cm h20 (9.7cm h20-95%) Airleaks 1.9L/min AHI 1.9  11/10/2022 Patient contacted today for annual follow-up/OSA. HST on 08/09/20 showed mild OSA, AHI 13 with SpO2 85%. He is maintained on auto CPAP 5-15cm h20. He is compliant with CPAP and reports benefit from use. He reports having more energy, he is not as tired in the evening. He sleeps well most nights. No issues with pressure settings or mask fit. He is a mouth breather. He uses full face mask. No dry mouth. DME company is [employer] download 10/10/2022 - 11/08/2022 Usage days 30/30 days (100%); 28 days (93%) greater than 4 hours Average usage 7 hours 1 minute Pressure 5 to 15 cm H2O (9.2 cm H2O-95%) Air leaks 4.2 L/min (95%) AHI 1.3  Mild sleep apnea: - Stable interval; Patient reports improvement in daytime sleepiness with CPAP therapy - HST on 08/09/20 showed mild OSA, AHI 13 with SpO2 85% - No issues with mask fit or pressure settings - Current CPAP pressure 5-15cm h20 (9.2cm h20-95%); Residual AHI 1.3/hour - No changes needed. Renew CPAP supplies  - Advised patient continue to wear CPAP nightly 4-6 hours or longer  - Encourage weight loss efforts and  side sleeping position   01/01/2024 - Interim hx  Discussed the use of AI scribe software for clinical note transcription with the patient, who gave verbal consent to proceed.  History of Present Illness   Timothy Coffey is a 38 year old male with sleep apnea who presents for a follow-up on CPAP therapy.  He has been using a CPAP machine for the past four years following a sleep study in 2021 that diagnosed him with mild sleep apnea, showing an average of thirteen  apneic events per hour. He uses his CPAP for an average of six hours and forty-five minutes per night, and his apnea score is 0.9 events per hour when using the CPAP.  He occasionally falls asleep without putting on his CPAP mask, especially after working in hot conditions, but overall, he uses the CPAP almost every night. He feels more energized and less tired during the day when he uses the CPAP, although he still finds it difficult to wake up in the morning.  He regularly changes his CPAP supplies and cleans the machine, including using distilled water in the water chamber. He has not experienced any issues with the machine, which he received in January 2022.      [employer] download 11/01/23-12/30/23 Usage days 52/60 days (87%); 49 days (82%) greater than 4 hours Average usage days used 6 hours 44 minutes Pressure 5 to 15 cm H2O (10.6 cm H2O-95%) Air leaks 2.3 L/min (95%) AHI 0.9    Observations/Objective:  Appears well without overt respiratory symptoms   Assessment and Plan:  1. OSA on CPAP (Primary)  Assessment and Plan    Obstructive Sleep Apnea Mild obstructive sleep apnea diagnosed in 2021 with an average of 13 apneic events per hour. Currently well-controlled with CPAP therapy, showing less than 1 apneic event per hour. CPAP usage is consistent at 87% of nights, averaging 6 hours and 45 minutes per night. He reports occasional nights of falling asleep without the CPAP but overall feels more energized and less tired during the day when using the CPAP. No issues with the current CPAP machine, which is functioning well. Supplies are being changed regularly, and the machine is being cleaned appropriately. Replacement machine eligibility anticipated in January 2027. - Continue CPAP therapy with current settings 5-15cm h20  - Renew CPAP supplies with the medical supply store.  Follow Up Instructions:  - Schedule follow-up in one year.   I discussed the assessment and treatment plan  with the patient. The patient was provided an opportunity to ask questions and all were answered. The patient agreed with the plan and demonstrated an understanding of the instructions.   The patient was advised to call back or seek an in-person evaluation if the symptoms worsen or if the condition fails to improve as anticipated.  I provided 22 minutes of non-face-to-face time during this encounter.   Antonio Baumgarten, NP

## 2024-01-01 NOTE — Progress Notes (Deleted)
 @  Patient ID: Timothy Coffey, male    DOB: [birth year], 38 y.o.   MRN: [id number]  Chief Complaint  Patient presents with   Follow-up    CPAP f/u     Referring provider: Alto Atta, NP  HPI:       Allergies  Allergen Reactions   Shellfish Allergy Other (See Comments)    "abdominal pain"    Immunization History  Administered Date(s) Administered   Influenza Inj Mdck Quad Pf 07/07/2019   Influenza,inj,Quad PF,6+ Mos 08/10/2021   Influenza-Unspecified 06/19/2020   Moderna Sars-Covid-2 Vaccination 10/16/2019, 11/17/2019, 06/19/2020   Tdap 12/08/2011    Past Medical History:  Diagnosis Date   Allergy    Bradycardia    GERD (gastroesophageal reflux disease)    Obesity    OSA on CPAP 11/03/2021    Tobacco History: Social History   Tobacco Use  Smoking Status Former   Current packs/day: 0.00   Average packs/day: 0.5 packs/day for 3.0 years (1.5 ttl pk-yrs)   Types: Cigarettes   Start date: 2005   Quit date: 2008   Years since quitting: 17.3  Smokeless Tobacco Never   Counseling given: Not Answered   Outpatient Medications Prior to Visit  Medication Sig Dispense Refill   fluticasone  (FLONASE ) 50 MCG/ACT nasal spray Place into both nostrils daily as needed.     ketoconazole  (NIZORAL ) 2 % cream Apply topically daily as needed. 30 g 0   ketoconazole  (NIZORAL ) 2 % shampoo Apply 1 Application topically 2 (two) times a week. 120 mL 0   No facility-administered medications prior to visit.      Review of Systems  Review of Systems   Physical Exam  Ht 5' 11.5" (1.816 m)   Wt 200 lb (90.7 kg)   BMI 27.51 kg/m  Physical Exam   Lab Results:  CBC    Component Value Date/Time   WBC 7.9 08/10/2022 1457   RBC 4.78 08/10/2022 1457   HGB 14.6 08/10/2022 1457   HCT 42.3 08/10/2022 1457   PLT 276.0 08/10/2022 1457   MCV 88.5 08/10/2022 1457   MCH 31.0 01/23/2020 2318   MCHC 34.6 08/10/2022 1457   RDW 12.4 08/10/2022 1457   LYMPHSABS 2.4  08/10/2022 1457   MONOABS 0.4 08/10/2022 1457   EOSABS 0.1 08/10/2022 1457   BASOSABS 0.0 08/10/2022 1457    BMET    Component Value Date/Time   NA 138 08/10/2022 1457   NA 141 11/07/2017 0000   K 3.7 08/10/2022 1457   CL 101 08/10/2022 1457   CO2 29 08/10/2022 1457   GLUCOSE 88 08/10/2022 1457   BUN 15 08/10/2022 1457   BUN 12 11/07/2017 0000   CREATININE 0.92 08/10/2022 1457   CALCIUM 9.8 08/10/2022 1457   GFRNONAA >60 01/25/2020 0429   GFRAA >60 01/25/2020 0429    BNP No results found for: "BNP"  ProBNP No results found for: "PROBNP"  Imaging: No results found.   Assessment & Plan:   No problem-specific Assessment & Plan notes found for this encounter.     Antonio Baumgarten, NP 01/01/2024

## 2024-02-13 DIAGNOSIS — G4733 Obstructive sleep apnea (adult) (pediatric): Secondary | ICD-10-CM | POA: Diagnosis not present

## 2024-07-21 ENCOUNTER — Encounter: Payer: Self-pay | Admitting: Adult Health

## 2024-07-22 NOTE — Telephone Encounter (Signed)
 Please advise

## 2024-07-24 NOTE — Telephone Encounter (Signed)
Pt has been scheduled for CPE.  

## 2024-07-31 ENCOUNTER — Ambulatory Visit: Admitting: Adult Health

## 2024-07-31 ENCOUNTER — Encounter: Payer: Self-pay | Admitting: Adult Health

## 2024-07-31 VITALS — BP 130/82 | HR 72 | Temp 98.0°F | Ht 70.5 in | Wt 220.0 lb

## 2024-07-31 DIAGNOSIS — Z0000 Encounter for general adult medical examination without abnormal findings: Secondary | ICD-10-CM

## 2024-07-31 DIAGNOSIS — E785 Hyperlipidemia, unspecified: Secondary | ICD-10-CM | POA: Diagnosis not present

## 2024-07-31 DIAGNOSIS — E6609 Other obesity due to excess calories: Secondary | ICD-10-CM | POA: Diagnosis not present

## 2024-07-31 DIAGNOSIS — Z6831 Body mass index (BMI) 31.0-31.9, adult: Secondary | ICD-10-CM | POA: Diagnosis not present

## 2024-07-31 DIAGNOSIS — Z23 Encounter for immunization: Secondary | ICD-10-CM | POA: Diagnosis not present

## 2024-07-31 DIAGNOSIS — E66811 Obesity, class 1: Secondary | ICD-10-CM

## 2024-07-31 DIAGNOSIS — G4730 Sleep apnea, unspecified: Secondary | ICD-10-CM | POA: Diagnosis not present

## 2024-07-31 NOTE — Progress Notes (Signed)
 Subjective:    Patient ID: Timothy Coffey, male    DOB: 1985-12-13, 38 y.o.   MRN: [id number]  HPI Patient presents for yearly preventative medicine examination. He is a pleasant 38 year old male who  has a past medical history of Allergy, Bradycardia, GERD (gastroesophageal reflux disease), Obesity, and OSA on CPAP (11/03/2021).  OSA -he was diagnosed with mild OSA in February 2022.  Using CPAP with full facemask.  Hyperlipidemia - mildly elevated LDL cholesterol. Not currently on any medication  Lab Results  Component Value Date   CHOL 190 08/10/2022   HDL 54.60 08/10/2022   LDLCALC 120 (H) 08/10/2022   TRIG 76.0 08/10/2022   CHOLHDL 3 08/10/2022   All immunizations and health maintenance protocols were reviewed with the patient and needed orders were placed.  Appropriate screening laboratory values were ordered for the patient including screening of hyperlipidemia, renal function and hepatic function.  Medication reconciliation,  past medical history, social history, problem list and allergies were reviewed in detail with the patient  Goals were established with regard to weight loss, exercise, and  diet in compliance with medications. He has stopped eating healthy and exercising but plans to start back .  Wt Readings from Last 3 Encounters:  07/31/24 220 lb (99.8 kg)  01/01/24 200 lb (90.7 kg)  08/10/22 179 lb (81.2 kg)   He has no acute complaints.   Review of Systems  Constitutional: Negative.   HENT: Negative.    Eyes: Negative.   Respiratory: Negative.    Cardiovascular: Negative.   Gastrointestinal: Negative.   Endocrine: Negative.   Genitourinary: Negative.   Musculoskeletal: Negative.   Skin: Negative.   Allergic/Immunologic: Negative.   Neurological: Negative.   Hematological: Negative.   Psychiatric/Behavioral: Negative.    All other systems reviewed and are negative.  Past Medical History:  Diagnosis Date   Allergy    Bradycardia    GERD  (gastroesophageal reflux disease)    Obesity    OSA on CPAP 11/03/2021    Social History   Socioeconomic History   Marital status: Married    Spouse name: Not on file   Number of children: Not on file   Years of education: Not on file   Highest education level: 12th grade  Occupational History   Occupation: Personnel Officer  Tobacco Use   Smoking status: Former    Current packs/day: 0.00    Average packs/day: 0.5 packs/day for 3.0 years (1.5 ttl pk-yrs)    Types: Cigarettes    Start date: 2005    Quit date: 2008    Years since quitting: 17.9   Smokeless tobacco: Never  Vaping Use   Vaping status: Never Used  Substance and Sexual Activity   Alcohol use: Yes    Alcohol/week: 0.0 standard drinks of alcohol    Comment: 2-6 beers per day   Drug use: No   Sexual activity: Not on file  Other Topics Concern   Not on file  Social History Narrative   He is an personnel officer for [employer]   Married    Two children       He likes to be outside    Social Drivers of Health   Tobacco Use: Medium Risk (07/31/2024)   Patient History    Smoking Tobacco Use: Former    Smokeless Tobacco Use: Never    Passive Exposure: Not on file  Financial Resource Strain: Low Risk (07/30/2024)   Overall Financial Resource Strain (CARDIA)    Difficulty  of Paying Living Expenses: Not hard at all  Food Insecurity: No Food Insecurity (07/30/2024)   Epic    Worried About Programme Researcher, Broadcasting/film/video in the Last Year: Never true    Ran Out of Food in the Last Year: Never true  Transportation Needs: No Transportation Needs (07/30/2024)   Epic    Lack of Transportation (Medical): No    Lack of Transportation (Non-Medical): No  Physical Activity: Insufficiently Active (07/30/2024)   Exercise Vital Sign    Days of Exercise per Week: 3 days    Minutes of Exercise per Session: 30 min  Stress: No Stress Concern Present (07/30/2024)   [employer] of Occupational Health - Occupational Stress Questionnaire     Feeling of Stress: Only a little  Social Connections: Moderately Isolated (07/30/2024)   Social Connection and Isolation Panel    Frequency of Communication with Friends and Family: More than three times a week    Frequency of Social Gatherings with Friends and Family: Once a week    Attends Religious Services: Patient declined    Database Administrator or Organizations: No    Attends Engineer, Structural: Not on file    Marital Status: Living with partner  Intimate Partner Violence: Not on file  Depression (PHQ2-9): Low Risk (07/31/2024)   Depression (PHQ2-9)    PHQ-2 Score: 0  Alcohol Screen: Medium Risk (07/30/2024)   Alcohol Screen    Last Alcohol Screening Score (AUDIT): 8  Housing: Low Risk (07/30/2024)   Epic    Unable to Pay for Housing in the Last Year: No    Number of Times Moved in the Last Year: 0    Homeless in the Last Year: No  Utilities: Not on file  Health Literacy: Not on file    No past surgical history on file.  Family History  Problem Relation Age of Onset   Arthritis Father    Heart disease Father    Skin cancer Father    Hypertension Other        Family history    Hyperlipidemia Other        Family History   Thyroid  disease Mother     Allergies[1]  Medications Ordered Prior to Encounter[2]  BP 130/82   Pulse 72   Temp 98 F (36.7 C) (Oral)   Ht 5' 10.5 (1.791 m)   Wt 220 lb (99.8 kg)   SpO2 98%   BMI 31.12 kg/m       Objective:   Physical Exam Vitals and nursing note reviewed.  Constitutional:      General: He is not in acute distress.    Appearance: Normal appearance. He is obese. He is not ill-appearing.  HENT:     Head: Normocephalic and atraumatic.     Right Ear: Tympanic membrane, ear canal and external ear normal. There is no impacted cerumen.     Left Ear: Tympanic membrane, ear canal and external ear normal. There is no impacted cerumen.     Nose: Nose normal. No congestion or rhinorrhea.     Mouth/Throat:      Mouth: Mucous membranes are moist.     Pharynx: Oropharynx is clear.  Eyes:     Extraocular Movements: Extraocular movements intact.     Conjunctiva/sclera: Conjunctivae normal.     Pupils: Pupils are equal, round, and reactive to light.  Neck:     Vascular: No carotid bruit.  Cardiovascular:     Rate and Rhythm: Normal rate and  regular rhythm.     Pulses: Normal pulses.     Heart sounds: No murmur heard.    No friction rub. No gallop.  Pulmonary:     Effort: Pulmonary effort is normal.     Breath sounds: Normal breath sounds.  Abdominal:     General: Abdomen is flat. Bowel sounds are normal. There is no distension.     Palpations: Abdomen is soft. There is no mass.     Tenderness: There is no abdominal tenderness. There is no guarding or rebound.     Hernia: No hernia is present.  Musculoskeletal:        General: Normal range of motion.     Cervical back: Normal range of motion and neck supple.  Lymphadenopathy:     Cervical: No cervical adenopathy.  Skin:    General: Skin is warm and dry.     Capillary Refill: Capillary refill takes less than 2 seconds.  Neurological:     General: No focal deficit present.     Mental Status: He is alert and oriented to person, place, and time.  Psychiatric:        Mood and Affect: Mood normal.        Behavior: Behavior normal.        Thought Content: Thought content normal.        Judgment: Judgment normal.           Assessment & Plan:  1. Routine general medical examination at a health care facility (Primary) Today patient counseled on age appropriate routine health concerns for screening and prevention, each reviewed and up to date or declined. Immunizations reviewed and up to date or declined. Labs ordered and reviewed. Risk factors for depression reviewed and negative. Hearing function and visual acuity are intact. ADLs screened and addressed as needed. Functional ability and level of safety reviewed and appropriate. Education,  counseling and referrals performed based on assessed risks today. Patient provided with a copy of personalized plan for preventive services. - Follow up in one year or sooner if needed  2. Sleep apnea, unspecified type - Continue with CPAP - Lipid panel; Future - TSH; Future - CBC; Future - Comprehensive metabolic panel with GFR; Future - Comprehensive metabolic panel with GFR - CBC - TSH - Lipid panel  3. Hyperlipidemia, unspecified hyperlipidemia type - Consider statin  - Lipid panel; Future - TSH; Future - CBC; Future - Comprehensive metabolic panel with GFR; Future - Comprehensive metabolic panel with GFR - CBC - TSH - Lipid panel  4. Class 1 obesity due to excess calories without serious comorbidity with body mass index (BMI) of 31.0 to 31.9 in adult - Work on weight loss measures through lifestyle modifications  - Lipid panel; Future - TSH; Future - CBC; Future - Comprehensive metabolic panel with GFR; Future - Comprehensive metabolic panel with GFR - CBC - TSH - Lipid panel  5. Need for influenza vaccination  - Flu vaccine trivalent PF, 6mos and older(Flulaval,Afluria,Fluarix,Fluzone)  6. Need for tetanus booster  - Tdap vaccine greater than or equal to 7yo IM  Timothy Peeters, NP     [1]  Allergies Allergen Reactions   Shellfish Allergy Other (See Comments)    abdominal pain  [2]  Current Outpatient Medications on File Prior to Visit  Medication Sig Dispense Refill   fluticasone  (FLONASE ) 50 MCG/ACT nasal spray Place into both nostrils daily as needed.     ketoconazole  (NIZORAL ) 2 % cream Apply topically daily as needed. 30  g 0   ketoconazole  (NIZORAL ) 2 % shampoo Apply 1 Application topically 2 (two) times a week. 120 mL 0   No current facility-administered medications on file prior to visit.

## 2024-08-01 ENCOUNTER — Ambulatory Visit: Payer: Self-pay | Admitting: Adult Health

## 2024-08-01 LAB — LIPID PANEL
Cholesterol: 209 mg/dL — ABNORMAL HIGH (ref 28–200)
HDL: 56.6 mg/dL
LDL Cholesterol: 130 mg/dL — ABNORMAL HIGH (ref 10–99)
NonHDL: 152.58
Total CHOL/HDL Ratio: 4
Triglycerides: 112 mg/dL (ref 10.0–149.0)
VLDL: 22.4 mg/dL (ref 0.0–40.0)

## 2024-08-01 LAB — COMPREHENSIVE METABOLIC PANEL WITH GFR
ALT: 20 U/L (ref 3–53)
AST: 19 U/L (ref 5–37)
Albumin: 4.9 g/dL (ref 3.5–5.2)
Alkaline Phosphatase: 41 U/L (ref 39–117)
BUN: 15 mg/dL (ref 6–23)
CO2: 26 meq/L (ref 19–32)
Calcium: 9.3 mg/dL (ref 8.4–10.5)
Chloride: 101 meq/L (ref 96–112)
Creatinine, Ser: 0.98 mg/dL (ref 0.40–1.50)
GFR: 98.86 mL/min
Glucose, Bld: 76 mg/dL (ref 70–99)
Potassium: 3.8 meq/L (ref 3.5–5.1)
Sodium: 137 meq/L (ref 135–145)
Total Bilirubin: 1 mg/dL (ref 0.2–1.2)
Total Protein: 7.9 g/dL (ref 6.0–8.3)

## 2024-08-01 LAB — CBC
HCT: 43.5 % (ref 39.0–52.0)
Hemoglobin: 14.9 g/dL (ref 13.0–17.0)
MCHC: 34.3 g/dL (ref 30.0–36.0)
MCV: 90.7 fl (ref 78.0–100.0)
Platelets: 215 K/uL (ref 150.0–400.0)
RBC: 4.8 Mil/uL (ref 4.22–5.81)
RDW: 12.4 % (ref 11.5–15.5)
WBC: 8.4 K/uL (ref 4.0–10.5)

## 2024-08-01 LAB — TSH: TSH: 1.64 u[IU]/mL (ref 0.35–5.50)
